# Patient Record
Sex: Male | Born: 1935 | Race: White | Hispanic: No | Marital: Married | Smoking: Former smoker
Health system: Southern US, Community
[De-identification: ages and names within clinical notes are randomized; demographics above are authoritative.]

## PROBLEM LIST (undated history)

## (undated) DIAGNOSIS — G5600 Carpal tunnel syndrome, unspecified upper limb: Secondary | ICD-10-CM

## (undated) DIAGNOSIS — R350 Frequency of micturition: Secondary | ICD-10-CM

## (undated) DIAGNOSIS — J449 Chronic obstructive pulmonary disease, unspecified: Secondary | ICD-10-CM

## (undated) DIAGNOSIS — F0390 Unspecified dementia without behavioral disturbance: Secondary | ICD-10-CM

## (undated) DIAGNOSIS — K449 Diaphragmatic hernia without obstruction or gangrene: Secondary | ICD-10-CM

## (undated) DIAGNOSIS — J189 Pneumonia, unspecified organism: Secondary | ICD-10-CM

## (undated) DIAGNOSIS — C679 Malignant neoplasm of bladder, unspecified: Secondary | ICD-10-CM

## (undated) DIAGNOSIS — E538 Deficiency of other specified B group vitamins: Secondary | ICD-10-CM

## (undated) DIAGNOSIS — F419 Anxiety disorder, unspecified: Secondary | ICD-10-CM

## (undated) DIAGNOSIS — K219 Gastro-esophageal reflux disease without esophagitis: Secondary | ICD-10-CM

## (undated) DIAGNOSIS — K8050 Calculus of bile duct without cholangitis or cholecystitis without obstruction: Secondary | ICD-10-CM

## (undated) DIAGNOSIS — R0602 Shortness of breath: Secondary | ICD-10-CM

## (undated) DIAGNOSIS — M1990 Unspecified osteoarthritis, unspecified site: Secondary | ICD-10-CM

## (undated) DIAGNOSIS — I10 Essential (primary) hypertension: Secondary | ICD-10-CM

## (undated) DIAGNOSIS — I2699 Other pulmonary embolism without acute cor pulmonale: Secondary | ICD-10-CM

## (undated) DIAGNOSIS — K222 Esophageal obstruction: Secondary | ICD-10-CM

## (undated) DIAGNOSIS — K5790 Diverticulosis of intestine, part unspecified, without perforation or abscess without bleeding: Secondary | ICD-10-CM

## (undated) DIAGNOSIS — C649 Malignant neoplasm of unspecified kidney, except renal pelvis: Secondary | ICD-10-CM

## (undated) DIAGNOSIS — F39 Unspecified mood [affective] disorder: Secondary | ICD-10-CM

## (undated) DIAGNOSIS — K851 Biliary acute pancreatitis without necrosis or infection: Secondary | ICD-10-CM

## (undated) HISTORY — DX: Deficiency of other specified B group vitamins: E53.8

## (undated) HISTORY — DX: Chronic obstructive pulmonary disease, unspecified: J44.9

## (undated) HISTORY — DX: Frequency of micturition: R35.0

## (undated) HISTORY — DX: Essential (primary) hypertension: I10

## (undated) HISTORY — PX: ESOPHAGOGASTRODUODENOSCOPY: SHX1529

## (undated) HISTORY — DX: Unspecified osteoarthritis, unspecified site: M19.90

## (undated) HISTORY — DX: Other pulmonary embolism without acute cor pulmonale: I26.99

## (undated) HISTORY — PX: PARTIAL NEPHRECTOMY: SHX414

## (undated) HISTORY — DX: Malignant neoplasm of unspecified kidney, except renal pelvis: C64.9

## (undated) HISTORY — DX: Unspecified dementia without behavioral disturbance: F03.90

## (undated) HISTORY — DX: Diaphragmatic hernia without obstruction or gangrene: K44.9

## (undated) HISTORY — DX: Calculus of bile duct without cholangitis or cholecystitis without obstruction: K80.50

## (undated) HISTORY — DX: Carpal tunnel syndrome, unspecified upper limb: G56.00

## (undated) HISTORY — DX: Unspecified mood (affective) disorder: F39

## (undated) HISTORY — DX: Biliary acute pancreatitis without necrosis or infection: K85.10

## (undated) HISTORY — PX: TRANSURETHRAL RESECTION OF PROSTATE: SHX73

## (undated) HISTORY — DX: Esophageal obstruction: K22.2

## (undated) HISTORY — DX: Diverticulosis of intestine, part unspecified, without perforation or abscess without bleeding: K57.90

## (undated) HISTORY — PX: CHOLECYSTECTOMY: SHX55

## (undated) HISTORY — DX: Malignant neoplasm of bladder, unspecified: C67.9

## (undated) HISTORY — PX: CYSTOSTOMY W/ BLADDER BIOPSY: SHX1431

## (undated) HISTORY — DX: Gastro-esophageal reflux disease without esophagitis: K21.9

---

## 1996-12-05 HISTORY — PX: INGUINAL HERNIA REPAIR: SUR1180

## 2001-03-14 ENCOUNTER — Ambulatory Visit (HOSPITAL_COMMUNITY): Admission: RE | Admit: 2001-03-14 | Discharge: 2001-03-14 | Payer: Self-pay | Admitting: Gastroenterology

## 2001-03-14 ENCOUNTER — Encounter (INDEPENDENT_AMBULATORY_CARE_PROVIDER_SITE_OTHER): Payer: Self-pay | Admitting: Specialist

## 2001-03-14 ENCOUNTER — Encounter: Payer: Self-pay | Admitting: Gastroenterology

## 2001-05-21 ENCOUNTER — Encounter: Payer: Self-pay | Admitting: Gastroenterology

## 2001-05-21 LAB — HM COLONOSCOPY

## 2001-05-29 ENCOUNTER — Encounter: Payer: Self-pay | Admitting: Urology

## 2001-05-29 ENCOUNTER — Encounter: Admission: RE | Admit: 2001-05-29 | Discharge: 2001-05-29 | Payer: Self-pay | Admitting: Urology

## 2001-06-08 ENCOUNTER — Encounter: Payer: Self-pay | Admitting: Urology

## 2001-06-13 ENCOUNTER — Inpatient Hospital Stay (HOSPITAL_COMMUNITY): Admission: RE | Admit: 2001-06-13 | Discharge: 2001-06-18 | Payer: Self-pay | Admitting: Urology

## 2001-06-13 ENCOUNTER — Encounter (INDEPENDENT_AMBULATORY_CARE_PROVIDER_SITE_OTHER): Payer: Self-pay | Admitting: Specialist

## 2001-06-13 ENCOUNTER — Encounter: Payer: Self-pay | Admitting: Urology

## 2001-06-15 ENCOUNTER — Encounter: Payer: Self-pay | Admitting: Urology

## 2002-02-02 ENCOUNTER — Encounter: Payer: Self-pay | Admitting: Emergency Medicine

## 2002-02-02 ENCOUNTER — Inpatient Hospital Stay (HOSPITAL_COMMUNITY): Admission: EM | Admit: 2002-02-02 | Discharge: 2002-02-14 | Payer: Self-pay | Admitting: Emergency Medicine

## 2002-02-03 ENCOUNTER — Encounter: Payer: Self-pay | Admitting: Emergency Medicine

## 2002-02-05 ENCOUNTER — Encounter: Payer: Self-pay | Admitting: Internal Medicine

## 2002-02-05 ENCOUNTER — Encounter: Payer: Self-pay | Admitting: General Surgery

## 2002-02-07 ENCOUNTER — Encounter: Payer: Self-pay | Admitting: Internal Medicine

## 2002-04-15 ENCOUNTER — Encounter: Payer: Self-pay | Admitting: Urology

## 2002-04-17 ENCOUNTER — Encounter (INDEPENDENT_AMBULATORY_CARE_PROVIDER_SITE_OTHER): Payer: Self-pay | Admitting: Specialist

## 2002-04-17 ENCOUNTER — Ambulatory Visit (HOSPITAL_COMMUNITY): Admission: RE | Admit: 2002-04-17 | Discharge: 2002-04-17 | Payer: Self-pay | Admitting: Urology

## 2002-09-30 ENCOUNTER — Emergency Department (HOSPITAL_COMMUNITY): Admission: EM | Admit: 2002-09-30 | Discharge: 2002-10-01 | Payer: Self-pay | Admitting: *Deleted

## 2002-10-05 ENCOUNTER — Encounter: Payer: Self-pay | Admitting: Emergency Medicine

## 2002-10-05 ENCOUNTER — Inpatient Hospital Stay (HOSPITAL_COMMUNITY): Admission: EM | Admit: 2002-10-05 | Discharge: 2002-10-12 | Payer: Self-pay | Admitting: Emergency Medicine

## 2002-10-06 ENCOUNTER — Encounter: Payer: Self-pay | Admitting: Internal Medicine

## 2002-10-10 ENCOUNTER — Encounter (INDEPENDENT_AMBULATORY_CARE_PROVIDER_SITE_OTHER): Payer: Self-pay | Admitting: *Deleted

## 2002-10-10 ENCOUNTER — Encounter: Payer: Self-pay | Admitting: General Surgery

## 2002-10-11 ENCOUNTER — Encounter: Payer: Self-pay | Admitting: General Surgery

## 2002-10-11 ENCOUNTER — Encounter: Payer: Self-pay | Admitting: Internal Medicine

## 2002-11-06 ENCOUNTER — Encounter: Payer: Self-pay | Admitting: Urology

## 2002-11-06 ENCOUNTER — Encounter: Admission: RE | Admit: 2002-11-06 | Discharge: 2002-11-06 | Payer: Self-pay | Admitting: Urology

## 2003-10-01 ENCOUNTER — Encounter (INDEPENDENT_AMBULATORY_CARE_PROVIDER_SITE_OTHER): Payer: Self-pay | Admitting: *Deleted

## 2003-10-01 ENCOUNTER — Ambulatory Visit (HOSPITAL_COMMUNITY): Admission: RE | Admit: 2003-10-01 | Discharge: 2003-10-01 | Payer: Self-pay | Admitting: Urology

## 2003-10-01 ENCOUNTER — Ambulatory Visit (HOSPITAL_BASED_OUTPATIENT_CLINIC_OR_DEPARTMENT_OTHER): Admission: RE | Admit: 2003-10-01 | Discharge: 2003-10-01 | Payer: Self-pay | Admitting: Urology

## 2004-02-04 ENCOUNTER — Ambulatory Visit (HOSPITAL_COMMUNITY): Admission: RE | Admit: 2004-02-04 | Discharge: 2004-02-04 | Payer: Self-pay | Admitting: Urology

## 2004-08-18 ENCOUNTER — Ambulatory Visit (HOSPITAL_COMMUNITY): Admission: RE | Admit: 2004-08-18 | Discharge: 2004-08-18 | Payer: Self-pay | Admitting: Urology

## 2004-09-01 ENCOUNTER — Encounter (INDEPENDENT_AMBULATORY_CARE_PROVIDER_SITE_OTHER): Payer: Self-pay | Admitting: Specialist

## 2004-09-01 ENCOUNTER — Ambulatory Visit (HOSPITAL_COMMUNITY): Admission: RE | Admit: 2004-09-01 | Discharge: 2004-09-01 | Payer: Self-pay | Admitting: Urology

## 2004-09-01 ENCOUNTER — Ambulatory Visit (HOSPITAL_BASED_OUTPATIENT_CLINIC_OR_DEPARTMENT_OTHER): Admission: RE | Admit: 2004-09-01 | Discharge: 2004-09-01 | Payer: Self-pay | Admitting: Urology

## 2004-10-26 ENCOUNTER — Ambulatory Visit: Payer: Self-pay | Admitting: Family Medicine

## 2005-02-03 ENCOUNTER — Ambulatory Visit: Payer: Self-pay | Admitting: Gastroenterology

## 2005-03-09 ENCOUNTER — Encounter (INDEPENDENT_AMBULATORY_CARE_PROVIDER_SITE_OTHER): Payer: Self-pay | Admitting: Specialist

## 2005-03-09 ENCOUNTER — Ambulatory Visit (HOSPITAL_BASED_OUTPATIENT_CLINIC_OR_DEPARTMENT_OTHER): Admission: RE | Admit: 2005-03-09 | Discharge: 2005-03-09 | Payer: Self-pay | Admitting: Urology

## 2005-03-09 ENCOUNTER — Ambulatory Visit (HOSPITAL_COMMUNITY): Admission: RE | Admit: 2005-03-09 | Discharge: 2005-03-09 | Payer: Self-pay | Admitting: Urology

## 2005-08-17 ENCOUNTER — Ambulatory Visit (HOSPITAL_COMMUNITY): Admission: RE | Admit: 2005-08-17 | Discharge: 2005-08-17 | Payer: Self-pay | Admitting: Urology

## 2005-11-23 ENCOUNTER — Ambulatory Visit (HOSPITAL_COMMUNITY): Admission: RE | Admit: 2005-11-23 | Discharge: 2005-11-23 | Payer: Self-pay | Admitting: Urology

## 2005-11-23 ENCOUNTER — Encounter (INDEPENDENT_AMBULATORY_CARE_PROVIDER_SITE_OTHER): Payer: Self-pay | Admitting: Specialist

## 2006-03-30 ENCOUNTER — Ambulatory Visit: Payer: Self-pay | Admitting: Gastroenterology

## 2006-05-10 ENCOUNTER — Ambulatory Visit (HOSPITAL_BASED_OUTPATIENT_CLINIC_OR_DEPARTMENT_OTHER): Admission: RE | Admit: 2006-05-10 | Discharge: 2006-05-10 | Payer: Self-pay | Admitting: Urology

## 2006-05-10 ENCOUNTER — Encounter (INDEPENDENT_AMBULATORY_CARE_PROVIDER_SITE_OTHER): Payer: Self-pay | Admitting: Specialist

## 2007-04-06 ENCOUNTER — Ambulatory Visit: Payer: Self-pay | Admitting: Gastroenterology

## 2007-12-14 ENCOUNTER — Ambulatory Visit: Payer: Self-pay | Admitting: Internal Medicine

## 2007-12-14 DIAGNOSIS — H9319 Tinnitus, unspecified ear: Secondary | ICD-10-CM | POA: Insufficient documentation

## 2007-12-14 DIAGNOSIS — R3915 Urgency of urination: Secondary | ICD-10-CM

## 2007-12-14 DIAGNOSIS — C649 Malignant neoplasm of unspecified kidney, except renal pelvis: Secondary | ICD-10-CM | POA: Insufficient documentation

## 2007-12-14 DIAGNOSIS — G589 Mononeuropathy, unspecified: Secondary | ICD-10-CM | POA: Insufficient documentation

## 2007-12-14 DIAGNOSIS — C679 Malignant neoplasm of bladder, unspecified: Secondary | ICD-10-CM | POA: Insufficient documentation

## 2007-12-17 LAB — CONVERTED CEMR LAB
ALT: 15 units/L (ref 0–53)
AST: 24 units/L (ref 0–37)
Albumin: 4.3 g/dL (ref 3.5–5.2)
Alkaline Phosphatase: 73 units/L (ref 39–117)
BUN: 6 mg/dL (ref 6–23)
Basophils Absolute: 0.1 10*3/uL (ref 0.0–0.1)
Basophils Relative: 0.9 % (ref 0.0–1.0)
Bilirubin, Direct: 0.1 mg/dL (ref 0.0–0.3)
CO2: 31 meq/L (ref 19–32)
Calcium: 9.3 mg/dL (ref 8.4–10.5)
Chloride: 101 meq/L (ref 96–112)
Creatinine, Ser: 0.9 mg/dL (ref 0.4–1.5)
Eosinophils Absolute: 0.4 10*3/uL (ref 0.0–0.6)
Eosinophils Relative: 5.6 % — ABNORMAL HIGH (ref 0.0–5.0)
GFR calc Af Amer: 104 mL/min
GFR calc non Af Amer: 86 mL/min
Glucose, Bld: 92 mg/dL (ref 70–99)
HCT: 45.9 % (ref 39.0–52.0)
Hemoglobin: 15.4 g/dL (ref 13.0–17.0)
Lymphocytes Relative: 28.3 % (ref 12.0–46.0)
MCHC: 33.5 g/dL (ref 30.0–36.0)
MCV: 85.9 fL (ref 78.0–100.0)
Monocytes Absolute: 0.6 10*3/uL (ref 0.2–0.7)
Monocytes Relative: 9.5 % (ref 3.0–11.0)
Neutro Abs: 3.6 10*3/uL (ref 1.4–7.7)
Neutrophils Relative %: 55.7 % (ref 43.0–77.0)
Phosphorus: 3.3 mg/dL (ref 2.3–4.6)
Platelets: 132 10*3/uL — ABNORMAL LOW (ref 150–400)
Potassium: 3.7 meq/L (ref 3.5–5.1)
RBC: 5.35 M/uL (ref 4.22–5.81)
RDW: 13.2 % (ref 11.5–14.6)
Sodium: 140 meq/L (ref 135–145)
TSH: 1.32 microintl units/mL (ref 0.35–5.50)
Total Bilirubin: 1.1 mg/dL (ref 0.3–1.2)
Total Protein: 7.4 g/dL (ref 6.0–8.3)
Vitamin B-12: 240 pg/mL (ref 211–911)
WBC: 6.5 10*3/uL (ref 4.5–10.5)

## 2008-01-16 ENCOUNTER — Encounter: Payer: Self-pay | Admitting: Internal Medicine

## 2008-04-11 ENCOUNTER — Ambulatory Visit: Payer: Self-pay | Admitting: Gastroenterology

## 2008-04-11 ENCOUNTER — Encounter: Payer: Self-pay | Admitting: Gastroenterology

## 2008-04-11 LAB — CONVERTED CEMR LAB
ALT: 13 units/L (ref 0–53)
AST: 20 units/L (ref 0–37)
Albumin: 4 g/dL (ref 3.5–5.2)
Alkaline Phosphatase: 75 units/L (ref 39–117)
BUN: 5 mg/dL — ABNORMAL LOW (ref 6–23)
Basophils Absolute: 0.1 10*3/uL (ref 0.0–0.1)
Basophils Relative: 1.1 % — ABNORMAL HIGH (ref 0.0–1.0)
Bilirubin, Direct: 0.1 mg/dL (ref 0.0–0.3)
CO2: 33 meq/L — ABNORMAL HIGH (ref 19–32)
Calcium: 9.2 mg/dL (ref 8.4–10.5)
Chloride: 110 meq/L (ref 96–112)
Creatinine, Ser: 0.9 mg/dL (ref 0.4–1.5)
Eosinophils Absolute: 0.3 10*3/uL (ref 0.0–0.7)
Eosinophils Relative: 3.7 % (ref 0.0–5.0)
Ferritin: 61.5 ng/mL (ref 22.0–322.0)
Folate: 17.7 ng/mL
GFR calc Af Amer: 104 mL/min
GFR calc non Af Amer: 86 mL/min
Glucose, Bld: 100 mg/dL — ABNORMAL HIGH (ref 70–99)
HCT: 45.4 % (ref 39.0–52.0)
Hemoglobin: 15.3 g/dL (ref 13.0–17.0)
Iron: 85 ug/dL (ref 42–165)
Lymphocytes Relative: 21.8 % (ref 12.0–46.0)
MCHC: 33.6 g/dL (ref 30.0–36.0)
MCV: 84.9 fL (ref 78.0–100.0)
Monocytes Absolute: 0.8 10*3/uL (ref 0.1–1.0)
Monocytes Relative: 9.9 % (ref 3.0–12.0)
Neutro Abs: 4.8 10*3/uL (ref 1.4–7.7)
Neutrophils Relative %: 63.5 % (ref 43.0–77.0)
Platelets: 153 10*3/uL (ref 150–400)
Potassium: 3.9 meq/L (ref 3.5–5.1)
RBC: 5.35 M/uL (ref 4.22–5.81)
RDW: 13.3 % (ref 11.5–14.6)
Saturation Ratios: 25 % (ref 20.0–50.0)
Sodium: 144 meq/L (ref 135–145)
Total Bilirubin: 0.8 mg/dL (ref 0.3–1.2)
Total Protein: 7 g/dL (ref 6.0–8.3)
Transferrin: 242.5 mg/dL (ref >=212.0)
Vitamin B-12: 336 pg/mL (ref 211–911)
WBC: 7.7 10*3/uL (ref 4.5–10.5)

## 2008-04-16 ENCOUNTER — Ambulatory Visit: Payer: Self-pay | Admitting: Gastroenterology

## 2008-04-21 ENCOUNTER — Telehealth: Payer: Self-pay | Admitting: Gastroenterology

## 2008-05-19 ENCOUNTER — Encounter: Payer: Self-pay | Admitting: Family Medicine

## 2008-05-26 ENCOUNTER — Encounter: Payer: Self-pay | Admitting: Internal Medicine

## 2008-11-21 ENCOUNTER — Encounter: Payer: Self-pay | Admitting: Internal Medicine

## 2009-05-21 DIAGNOSIS — K2980 Duodenitis without bleeding: Secondary | ICD-10-CM | POA: Insufficient documentation

## 2009-05-21 DIAGNOSIS — K222 Esophageal obstruction: Secondary | ICD-10-CM | POA: Insufficient documentation

## 2009-05-21 DIAGNOSIS — K449 Diaphragmatic hernia without obstruction or gangrene: Secondary | ICD-10-CM | POA: Insufficient documentation

## 2009-05-22 ENCOUNTER — Ambulatory Visit: Payer: Self-pay | Admitting: Gastroenterology

## 2009-05-22 DIAGNOSIS — E538 Deficiency of other specified B group vitamins: Secondary | ICD-10-CM | POA: Insufficient documentation

## 2009-05-22 DIAGNOSIS — R209 Unspecified disturbances of skin sensation: Secondary | ICD-10-CM

## 2009-05-22 LAB — CONVERTED CEMR LAB
Ferritin: 118.1 ng/mL (ref 22.0–322.0)
Folate: 11.7 ng/mL
Iron: 90 ug/dL (ref 42–165)
Saturation Ratios: 29.2 % (ref 20.0–50.0)
Transferrin: 220.5 mg/dL (ref 212.0–360.0)
Vitamin B-12: >1500 pg/mL — ABNORMAL HIGH (ref 211–911)

## 2009-07-03 ENCOUNTER — Encounter: Payer: Self-pay | Admitting: Internal Medicine

## 2009-10-07 ENCOUNTER — Ambulatory Visit: Payer: Self-pay | Admitting: Internal Medicine

## 2009-10-07 DIAGNOSIS — I10 Essential (primary) hypertension: Secondary | ICD-10-CM

## 2009-10-09 LAB — CONVERTED CEMR LAB
ALT: 16 units/L (ref 0–53)
AST: 23 units/L (ref 0–37)
Albumin: 4.1 g/dL (ref 3.5–5.2)
Alkaline Phosphatase: 65 units/L (ref 39–117)
BUN: 6 mg/dL (ref 6–23)
Basophils Absolute: 0 10*3/uL (ref 0.0–0.1)
Basophils Relative: 0 % (ref 0.0–3.0)
Bilirubin, Direct: 0.1 mg/dL (ref 0.0–0.3)
CO2: 34 meq/L — ABNORMAL HIGH (ref 19–32)
Calcium: 8.9 mg/dL (ref 8.4–10.5)
Chloride: 103 meq/L (ref 96–112)
Creatinine, Ser: 0.9 mg/dL (ref 0.4–1.5)
Creatinine,U: 116.1 mg/dL
Eosinophils Absolute: 0.2 10*3/uL (ref 0.0–0.7)
Eosinophils Relative: 3.5 % (ref 0.0–5.0)
GFR calc non Af Amer: 85.88 mL/min (ref >60)
Glucose, Bld: 87 mg/dL (ref 70–99)
HCT: 43 % (ref 39.0–52.0)
Hemoglobin: 15 g/dL (ref 13.0–17.0)
Lymphocytes Relative: 25.6 % (ref 12.0–46.0)
Lymphs Abs: 1.8 10*3/uL (ref 0.7–4.0)
MCHC: 34.9 g/dL (ref 30.0–36.0)
MCV: 87.7 fL (ref 78.0–100.0)
Microalb Creat Ratio: 43.9 mg/g — ABNORMAL HIGH (ref 0.0–30.0)
Microalb, Ur: 5.1 mg/dL — ABNORMAL HIGH (ref 0.0–1.9)
Monocytes Absolute: 0.6 10*3/uL (ref 0.1–1.0)
Monocytes Relative: 8.8 % (ref 3.0–12.0)
Neutro Abs: 4.5 10*3/uL (ref 1.4–7.7)
Neutrophils Relative %: 62.1 % (ref 43.0–77.0)
Phosphorus: 3 mg/dL (ref 2.3–4.6)
Platelets: 133 10*3/uL — ABNORMAL LOW (ref 150.0–400.0)
Potassium: 4 meq/L (ref 3.5–5.1)
RBC: 4.91 M/uL (ref 4.22–5.81)
RDW: 12.8 % (ref 11.5–14.6)
Sodium: 142 meq/L (ref 135–145)
TSH: 0.97 microintl units/mL (ref 0.35–5.50)
Total Bilirubin: 0.9 mg/dL (ref 0.3–1.2)
Total Protein: 7.4 g/dL (ref 6.0–8.3)
Vitamin B-12: 679 pg/mL (ref 211–911)
WBC: 7.1 10*3/uL (ref 4.5–10.5)

## 2009-11-11 ENCOUNTER — Ambulatory Visit: Payer: Self-pay | Admitting: Internal Medicine

## 2009-11-13 LAB — CONVERTED CEMR LAB
Albumin: 4.3 g/dL (ref 3.5–5.2)
BUN: 11 mg/dL (ref 6–23)
Basophils Absolute: 0.1 10*3/uL (ref 0.0–0.1)
Basophils Relative: 0.9 % (ref 0.0–3.0)
CO2: 33 meq/L — ABNORMAL HIGH (ref 19–32)
Calcium: 9.6 mg/dL (ref 8.4–10.5)
Chloride: 102 meq/L (ref 96–112)
Creatinine, Ser: 1.1 mg/dL (ref 0.4–1.5)
Eosinophils Absolute: 0.3 10*3/uL (ref 0.0–0.7)
Eosinophils Relative: 3.9 % (ref 0.0–5.0)
GFR calc non Af Amer: 68.11 mL/min (ref >60)
Glucose, Bld: 110 mg/dL — ABNORMAL HIGH (ref 70–99)
HCT: 47.4 % (ref 39.0–52.0)
Hemoglobin: 15.9 g/dL (ref 13.0–17.0)
Lymphocytes Relative: 23.8 % (ref 12.0–46.0)
Lymphs Abs: 1.8 10*3/uL (ref 0.7–4.0)
MCHC: 33.7 g/dL (ref 30.0–36.0)
MCV: 87.7 fL (ref 78.0–100.0)
Monocytes Absolute: 0.7 10*3/uL (ref 0.1–1.0)
Monocytes Relative: 8.7 % (ref 3.0–12.0)
Neutro Abs: 4.6 10*3/uL (ref 1.4–7.7)
Neutrophils Relative %: 62.7 % (ref 43.0–77.0)
Phosphorus: 3.4 mg/dL (ref 2.3–4.6)
Platelets: 146 10*3/uL — ABNORMAL LOW (ref 150.0–400.0)
Potassium: 4.6 meq/L (ref 3.5–5.1)
RBC: 5.4 M/uL (ref 4.22–5.81)
RDW: 12.8 % (ref 11.5–14.6)
Sodium: 142 meq/L (ref 135–145)
Vitamin B-12: 922 pg/mL — ABNORMAL HIGH (ref 211–911)
WBC: 7.5 10*3/uL (ref 4.5–10.5)

## 2009-12-22 ENCOUNTER — Encounter: Payer: Self-pay | Admitting: Internal Medicine

## 2010-04-28 ENCOUNTER — Ambulatory Visit: Payer: Self-pay | Admitting: Internal Medicine

## 2010-04-28 DIAGNOSIS — G5600 Carpal tunnel syndrome, unspecified upper limb: Secondary | ICD-10-CM

## 2010-07-08 ENCOUNTER — Encounter: Payer: Self-pay | Admitting: Internal Medicine

## 2010-10-14 ENCOUNTER — Encounter: Payer: Self-pay | Admitting: Internal Medicine

## 2010-10-25 ENCOUNTER — Ambulatory Visit: Payer: Self-pay | Admitting: Internal Medicine

## 2010-10-26 LAB — CONVERTED CEMR LAB
ALT: 9 units/L (ref 0–53)
AST: 17 units/L (ref 0–37)
Albumin: 4 g/dL (ref 3.5–5.2)
Alkaline Phosphatase: 56 units/L (ref 39–117)
BUN: 8 mg/dL (ref 6–23)
Basophils Absolute: 0.1 10*3/uL (ref 0.0–0.1)
Basophils Relative: 0.7 % (ref 0.0–3.0)
Bilirubin, Direct: 0.1 mg/dL (ref 0.0–0.3)
CO2: 29 meq/L (ref 19–32)
Calcium: 8.9 mg/dL (ref 8.4–10.5)
Chloride: 101 meq/L (ref 96–112)
Creatinine, Ser: 1.1 mg/dL (ref 0.4–1.5)
Eosinophils Absolute: 0.3 10*3/uL (ref 0.0–0.7)
Eosinophils Relative: 3.7 % (ref 0.0–5.0)
GFR calc non Af Amer: 70.92 mL/min (ref >60)
Glucose, Bld: 95 mg/dL (ref 70–99)
HCT: 41.5 % (ref 39.0–52.0)
Hemoglobin: 13.8 g/dL (ref 13.0–17.0)
Lymphocytes Relative: 23.3 % (ref 12.0–46.0)
Lymphs Abs: 1.8 10*3/uL (ref 0.7–4.0)
MCHC: 33.3 g/dL (ref 30.0–36.0)
MCV: 87.4 fL (ref 78.0–100.0)
Monocytes Absolute: 0.7 10*3/uL (ref 0.1–1.0)
Monocytes Relative: 8.3 % (ref 3.0–12.0)
Neutro Abs: 5 10*3/uL (ref 1.4–7.7)
Neutrophils Relative %: 64 % (ref 43.0–77.0)
Phosphorus: 2.6 mg/dL (ref 2.3–4.6)
Platelets: 129 10*3/uL — ABNORMAL LOW (ref 150.0–400.0)
Potassium: 3.7 meq/L (ref 3.5–5.1)
RBC: 4.75 M/uL (ref 4.22–5.81)
RDW: 14 % (ref 11.5–14.6)
Sodium: 141 meq/L (ref 135–145)
TSH: 0.93 microintl units/mL (ref 0.35–5.50)
Total Bilirubin: 0.7 mg/dL (ref 0.3–1.2)
Total Protein: 6.8 g/dL (ref 6.0–8.3)
WBC: 7.9 10*3/uL (ref 4.5–10.5)

## 2010-11-04 HISTORY — PX: CARPAL TUNNEL RELEASE: SHX101

## 2010-11-08 ENCOUNTER — Encounter: Admission: RE | Admit: 2010-11-08 | Discharge: 2010-11-08 | Payer: Self-pay | Admitting: Orthopedic Surgery

## 2010-11-10 ENCOUNTER — Ambulatory Visit
Admission: RE | Admit: 2010-11-10 | Discharge: 2010-11-10 | Payer: Self-pay | Source: Home / Self Care | Attending: Orthopedic Surgery | Admitting: Orthopedic Surgery

## 2011-01-04 NOTE — Letter (Signed)
Summary: [facility]  [facility]   Imported By: Sherian Rein 12/25/2009 15:19:47  _____________________________________________________________________  External Attachment:    Type:   Image     Comment:   External Document  Appended Document: [facility] cysto benign

## 2011-01-04 NOTE — Consult Note (Signed)
Summary: [facility]  [facility] [place]   Imported By: Maryln Gottron 10/29/2010 15:58:10  _____________________________________________________________________  External Attachment:    Type:   Image     Comment:   External Document  Appended Document: [facility] planning right carpal tunnel release and then left some time after

## 2011-01-04 NOTE — Letter (Signed)
Summary: [facility]  [facility]   Imported By: Lanelle Bal 07/16/2010 09:44:55  _____________________________________________________________________  External Attachment:    Type:   Image     Comment:   External Document  Appended Document: [facility] cystoscopy shows no evidence of recurrent bladder cancer

## 2011-01-04 NOTE — Assessment & Plan Note (Signed)
Summary: 6 M F/U DLO   Vital Signs:  Patient profile:   77 year old male Weight:      176 pounds BMI:     24.99 Temp:     97.6 degrees F oral Pulse rate:   60 / minute Pulse rhythm:   regular BP sitting:   130 / 80  (left arm) Cuff size:   large  Vitals Entered By: Mervin Hack CMA Duncan Dull) (Apr 28, 2010 8:07 AM)  Serial Vital Signs/Assessments:  Time      Position  BP       Pulse  Resp  Temp     By           R Arm     140/68                         Cindee Salt MD  CC: 6 month follow-up   History of Present Illness: Having some problems with numbness in his hands Intermittent in both-- will wake him up at times occ burning sensation seems worse in cold weather does describe median nerve involvement  Back pain--esp if prolonged standing or walking no problem working in garden (and will work there for a long time) aspirin or advil will help  Stomach has been fine no symptoms on medicine  no problems with BP med no chest pain Does get DOE with extended walking No edema  Allergies: 1)  ! Tylenol  Past History:  Past medical, surgical, family and social histories (including risk factors) reviewed for relevance to current acute and chronic problems.  Past Medical History: Reviewed history from 10/07/2009 and no changes required. Bladder cancer--recurrent small tumors--excisions and BCG-------------------Dr Grapey Renal cancer--2002 Pulmonary embolus--soon after cancer surgery GERD------------------------------------------------------------------------------------Dr Jarold Motto Osteoarthritis Urinary frequency/urgency Hypertension  Past Surgical History: Reviewed history from 12/14/2007 and no changes required. 2002--renal and bladder surgery 1997/05/05- Right inguinal hernia 2003--Cholecystectomy EGD with dilation  ~ 05-May-2001 Colonoscopy  ~  05-May-2001  Family History: Mom died @77  stroke, heart problem Dad died of old age @87  3 brothers, 2 sisters--all died 1  brother had myelodysplasia Some CAD, DM Mom had HTN No prostate or colon cancer  Social History: Reviewed history from 12/14/2007 and no changes required. 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   Neck:  no masses, no thyromegaly, no carotid bruits, and no cervical lymphadenopathy.   very limited extension Lungs:  normal respiratory effort and normal breath sounds.   Heart:  normal rate, regular rhythm, no murmur, and no gallop.   Abdomen:  soft and non-tender.   Msk:  normal thenar emminence bilat--no muscle wasting Extremities:  no edema Neurologic:  normal hand and grip strength Skin:  multiple seb keratoses on back Psych:  normally interactive, good eye contact, not anxious appearing, and not depressed appearing.     Impression & Recommendations:  Problem # 1:  CARPAL TUNNEL SYNDROME (ICD-354.0) Assessment New  ongoing symptoms no weakness   discussed splints for night will refer to hand surgeon for eval  Orders: Orthopedic  Surgeon Referral (Ortho Surgeon)  Problem # 2:  HYPERTENSION (ICD-401.9) Assessment: Unchanged good control no changes needed  His updated medication list for this problem includes:    Triamterene-hctz 37.5-25 Mg Tabs (Triamterene-hctz) .Marland Kitchen... 1 tab daily for high blood pressure  BP today: 130/80 Prior BP: 110/70 (11/11/2009)  Labs Reviewed: K+: 4.6 (11/11/2009) Creat: : 1.1 (11/11/2009)     Problem # 3:  GERD (ICD-530.81) Assessment: Unchanged with past stricture needs to stay on PPI  His updated  medication list for this problem includes:    Nexium 40 Mg Cpdr (Esomeprazole magnesium) .Marland Kitchen... Take 1 tablet by mouth once a day  Problem # 4:  OSTEOARTHRITIS (ICD-715.90) Assessment: Unchanged esp back uses aspirin or low dose NSAID may have ankylosing spondylitis but no further eval indicated now  Complete Medication List: 1)  Triamterene-hctz 37.5-25 Mg Tabs (Triamterene-hctz) .Marland Kitchen.. 1 tab daily for high blood pressure 2)  Nexium 40 Mg Cpdr (Esomeprazole magnesium) .... Take 1 tablet by mouth once a day 3)  Advil Pm 200-25 Mg Caps (Ibuprofen-diphenhydramine hcl) .... As needed 4)  Vitamin B-12 500 Mcg Tabs (Cyanocobalamin) .... One tablet by mouth two times a day 5)  Bayer Low Strength 81 Mg Tbec (Aspirin) .... Take 1 by mouth once daily 6)  Bayer Aspirin 325 Mg Tabs (Aspirin) .... As needed  Patient Instructions: 1)  Please schedule a follow-up appointment in 6 months .  2)  Referral Appointment Information 3)  Day/Date: 4)  Time: 5)  Place/MD: 6)  Address: 7)  Phone/Fax: 8)  Patient given appointment information. Information/Orders faxed/mailed.  Current Allergies (reviewed today): ! TYLENOL

## 2011-01-04 NOTE — Assessment & Plan Note (Signed)
Summary: 6 M F/U DLO   Vital Signs:  Patient profile:   77 year old male Weight:      180 pounds O2 Sat:      95 % on Room air Temp:     97.8 degrees F oral Pulse rate:   58 / minute Pulse rhythm:   regular BP sitting:   146 / 60  (left arm) Cuff size:   large  Vitals Entered By: Mervin Hack CMA Duncan Dull) (October 25, 2010 12:20 PM)  O2 Flow:  Room air CC: 6 month follow-up   History of Present Illness: Did see surgeon eventually for his hands Then saw Dr Mina Marble in past couple of weeks Having surgery on right wrist soon has had increasing weakness so intervention appopriate  Gets upset quickly at times---per wife usually not due to something serious but he gets worked up not that frequently Generally happy No ongoing anxiety  Recent cystoscopy negative no hematuria  wife checks BP at times--it hurt his arm so she isn't checking now No chest pain No SOB No headaches  Does have arthritis pain in hands hard time making a fist on right Right shoulder and knee pain at times uses advil as needed --- does help  stomach okay No problems on the nexium---doesn't need it every day  Gets SOB when walking but exercise tolerance is stable  Allergies: 1)  ! Tylenol  Past History:  Past medical, surgical, family and social histories (including risk factors) reviewed for relevance to current acute and chronic problems.  Past Medical History: Bladder cancer--recurrent small tumors--excisions and BCG-------------------Dr Grapey Renal cancer--2002 Pulmonary embolus--soon after cancer surgery GERD------------------------------------------------------------------------------------Dr Jarold Motto Osteoarthritis Urinary frequency/urgency Hypertension COPD  Past Surgical History: Reviewed history from 12/14/2007 and no changes required. 2002--renal and bladder surgery 1998- Right inguinal hernia 2003--Cholecystectomy EGD with dilation  ~ 2002 Colonoscopy  ~   2002  Family History: Reviewed history from 04/28/2010 and no changes required. Mom died @77  stroke, heart problem Dad died of old age @87  3 brothers, 2 sisters--all died 1 brother had myelodysplasia Some CAD, DM Mom had HTN No prostate or colon cancer  Social History: Reviewed history from 12/14/2007 and no changes required. 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   Neck:  supple, no masses, no thyromegaly, no carotid bruits, and no cervical lymphadenopathy.   Lungs:  normal respiratory effort, no intercostal retractions, no accessory muscle use, and normal breath sounds.   Heart:  normal rate, regular rhythm, no murmur, and no gallop.   Abdomen:  soft and non-tender.   Msk:  no joint tenderness and no joint swelling.   SOme thickening in hands Extremities:  no edema Psych:  normally interactive, good eye contact, not anxious appearing, and not depressed appearing.     Impression & Recommendations:  Problem # 1:  HYPERTENSION (ICD-401.9) Assessment Unchanged  good control no problems with the med  His updated medication list for this problem includes:    Triamterene-hctz 37.5-25 Mg Tabs (Triamterene-hctz) .Marland Kitchen... 1 tab daily for high blood pressure  BP today: 146/60 Prior BP:  130/80 (04/28/2010)  Labs Reviewed: K+: 4.6 (11/11/2009) Creat: : 1.1 (11/11/2009)     Orders: TLB-Renal Function Panel (80069-RENAL) TLB-CBC Platelet - w/Differential (85025-CBCD) TLB-Hepatic/Liver Function Pnl (80076-HEPATIC) TLB-TSH (Thyroid Stimulating Hormone)  (84443-TSH) Venipuncture (20254)  Problem # 2:  GERD (ICD-530.81) Assessment: Unchanged good control with the medication doesn't need every day now  His updated medication list for this problem includes:    Nexium 40 Mg Cpdr (Esomeprazole magnesium) .Marland Kitchen... Take 1 tablet by mouth once a day  Problem # 3:  OSTEOARTHRITIS (ICD-715.90) Assessment: Comment Only okay with occ NSAID on PPI generally but will check labs  His updated medication list for this problem includes:    Bayer Low Strength 81 Mg Tbec (Aspirin) .Marland Kitchen... Take 1 by mouth once daily    Bayer Aspirin 325 Mg Tabs (Aspirin) .Marland Kitchen... As needed  Problem # 4:  COPD (ICD-496) Assessment: Unchanged mild DOE which is stable no meds needed  Complete Medication List: 1)  Triamterene-hctz 37.5-25 Mg Tabs (Triamterene-hctz) .Marland Kitchen.. 1 tab daily for high blood pressure 2)  Nexium 40 Mg Cpdr (Esomeprazole magnesium) .... Take 1 tablet by mouth once a day 3)  Advil Pm 200-25 Mg Caps (Ibuprofen-diphenhydramine hcl) .... As needed 4)  Vitamin B-12 500 Mcg Tabs (Cyanocobalamin) .... One tablet by mouth two times a day 5)  Bayer Low Strength 81 Mg Tbec (Aspirin) .... Take 1 by mouth once daily 6)  Bayer Aspirin 325 Mg Tabs (Aspirin) .... As needed 7)  Sm Potassium Gluconate 2.5 Meq Tabs (Potassium gluconate) .... Once daily 8)  Fish Oil 1000 Mg Caps (Omega-3 fatty acids) .... Once daily  Patient Instructions: 1)  Please schedule a follow-up appointment in 6 months .    Orders Added: 1)  TLB-Renal Function Panel [80069-RENAL] 2)  TLB-CBC Platelet - w/Differential [85025-CBCD] 3)  TLB-Hepatic/Liver Function Pnl [80076-HEPATIC] 4)  TLB-TSH (Thyroid Stimulating Hormone) [84443-TSH] 5)  Venipuncture [27062] 6)  Est. Patient Level IV [37628]    Current Allergies (reviewed today): ! TYLENOL

## 2011-01-18 ENCOUNTER — Encounter: Payer: Self-pay | Admitting: Internal Medicine

## 2011-02-01 NOTE — Letter (Signed)
Summary: [facility]  [facility]   Imported By: Kassie Mends 01/25/2011 10:05:01  _____________________________________________________________________  External Attachment:    Type:   Image     Comment:   External Document  Appended Document: [facility] renal ultrasound stable (post op changes) cysto just showed enlarged prostate

## 2011-02-14 LAB — BASIC METABOLIC PANEL
BUN: 7 mg/dL (ref 6–23)
CO2: 27 mEq/L (ref 19–32)
Calcium: 9 mg/dL (ref 8.4–10.5)
Chloride: 106 mEq/L (ref 96–112)
Creatinine, Ser: 0.9 mg/dL (ref 0.4–1.5)
GFR calc Af Amer: >60 mL/min (ref >60)
GFR calc non Af Amer: >60 mL/min (ref >60)
Glucose, Bld: 142 mg/dL — ABNORMAL HIGH (ref 70–99)
Potassium: 3.5 mEq/L (ref 3.5–5.1)
Sodium: 143 mEq/L (ref 135–145)

## 2011-02-14 LAB — POCT HEMOGLOBIN-HEMACUE: Hemoglobin: 14 g/dL (ref 13.0–17.0)

## 2011-04-19 NOTE — Assessment & Plan Note (Signed)
[facility]                         GASTROENTEROLOGY OFFICE NOTE   Bryan, David                        MRN:          [id number]  DATE:04/11/2008                            DOB:          [birth year]    Bryan David is having worsening reflux and dysphagia.  He, otherwise is  asymptomatic.  He has severe chronic obstructive lung disease and feels  like his dyspnea is worse and he has really limited exercise tolerance.  He has not had a chest x-ray in many years. He has a dry, nonproductive  cough.  He denies any cardiovascular issues. His bowels are moving well  and he denies any melena or hematochezia.  He is up to date on his  colonoscopy exam.  He is followed now by Dr. Alphonsus Sias but I cannot see  where he has had labs done in some time.  His past history is remarkable  for previous nephrectomy for hypernephroma and also cholecystectomy.  He  has rather frequent urologic evaluations with Dr. Isabel Caprice.  I did check  labs in April of 2007 which were fairly normal.  This did not include  PSA level.  His surgery was previously performed by Dr. Claud Kelp.   MEDICATIONS:  The only medications he is taking at this time are Nexium  daily and weekly some B12 pills because of numbness and tingling in his  hands.  I cannot see on his chart where he has had a previous diagnosis  of B12 deficiency or pernicious anemia.   PHYSICAL EXAMINATION:  VITAL SIGNS:  He weighs 190 pounds and blood  pressure is 146/88, pulse 60 and regular.  HEENT:  I could not appreciate stigmata of chronic liver disease.  CHEST:  Showed diminished breath sounds in both lung fields with rhonchi  in the right lower lung field.  CARDIAC:  Exam showed him to be in a regular rhythm without murmurs,  rubs or gallops.  ABDOMEN:  Abdominal exam was difficult because of a board-like abdomen  because of musculature.  There are certainly no areas of tenderness or  noted masses.  Bowel sounds  were normal.  RECTAL:  Rectal exam is deferred.  EXTREMITIES:  Peripheral extremities were unremarkable.   ASSESSMENT:  Bryan David has worsening acid reflux despite daily Nexium  and this may contribute to some of his pulmonary fibrosis and emphysema.  He has a greater than 40 year smoking history.   RECOMMENDATIONS:  1. Check labs and chest x-ray.  2. Outpatient endoscopy and dilation at his convenience.  3. Reflux regime and increase Nexium to 40 mg twice a day.     Vania Rea. Jarold Motto, MD, Caleen Essex, FAGA  Electronically Signed    DRP/MedQ  DD: 04/11/2008  DT: 04/11/2008  Job #: [id number]   cc:   Valetta Fuller, M.D.  Karie Schwalbe, MD  Angelia Mould. Derrell Lolling, M.D.

## 2011-04-22 NOTE — H&P (Signed)
NAME:  Bryan David, NHAN NO.:  [id number]   MEDICAL RECORD NO.:  [id number]                   PATIENT TYPE:  INP   LOCATION:  0452                                 FACILITY:  [facility]   PHYSICIAN:  Georgina Quint. Plotnikov, M.D. [facility]      DATE OF BIRTH:  12-06-1924   DATE OF ADMISSION:  10/05/2002  DATE OF DISCHARGE:                                HISTORY & PHYSICAL   CHIEF COMPLAINT:  Nausea, vomiting, abdominal pain.   HISTORY OF PRESENT ILLNESS:  The patient is a 77 year old male with the  above complaints, by Monday getting progressively worse.  He was seen on  October 27 in the ER with abdominal complaints.  His amylase and lipase were  normal at the time.  It has gotten progressively worse with more abdominal  distention, nausea, and vomiting.  He was found to have highly elevated  amylase and lipase with abnormal liver function tests this time.  He has no  history of cholecystitis, history of GERD with esophageal strictures,  history of PE in March 2003 with subsequent anticoagulation, history of  right renal cell carcinoma and bladder cancer.   MEDICATIONS:  1. Nexium.  2. Coumadin.   ALLERGIES:  None.   SOCIAL HISTORY:  He is married.  No alcohol.  Remote history of smoking.   FAMILY HISTORY:  Positive for coronary disease.   REVIEW OF SYSTEMS:  Shortness of breath, abdominal distention, chronic, had  a colonoscopy about a year ago or so.  No GI bleeding like symptoms.  No  syncope and no chest pain.  The rest is as above or negative.   PHYSICAL EXAMINATION:  VITAL SIGNS:  Temperature 98.8, blood pressure  184/92, heart rate 80, respirations 18, O2 saturation 96%.  ABDOMEN:  The abdomen is tender and distended.  No rebound symptoms.  Bowel  sounds decreased.  No organomegaly.  HEENT:  Dry mucosa.  NECK:  Supple.  No thyromegaly or bruits.  LUNGS:  Decreased breath sounds, no wheezes or rales.  HEART:  S1, S2, slight tachycardia.  No  gallop.  EXTREMITIES:  Lower extremities without edema.  Peripheral pulses symmetric  and normal.  NEUROLOGIC:  He is alert, oriented,  and cooperative.  He denies being  depressed.   LABORATORY DATA:  On September 30, 2002:  Sodium 136, potassium 3.5,  creatinine 0.8, lipase 28, amylase 78.  Today his white count is 14.1,  hemoglobin 15.1, platelets 202, sodium 138, potassium 3.4, INR 2.9, glucose  122, creatinine 0.9, calcium 8.8, AST 297, ALT 253.  Alkaline phosphatase  256, total bilirubin 5.1, amylase 2410, lipase 1357.   ASSESSMENT/PLAN:  1. Abdominal pain due to problem #2.  Will treat with IV morphine.  2. Pancreatitis, probable acute cholecystitis.  IV antibiotics.  Surgical     consult, obtain another ultrasound.  3. Elevated liver function tests due to problem #2.  Recheck tomorrow.  4.  Hypertension.  Will start therapy.  5. History of bladder cancer.  6. History of right renal cell cancer, seen by Dr. ________________.  7. Anticoagulation.  Will hold Coumadin.  Change to Lovenox.  8. Dehydration.  Will use IV fluids.                                                  Georgina Quint. Plotnikov, M.D. [facility]    AVP/MEDQ  D:  10/05/2002  T:  10/05/2002  Job:  [id number]   cc:   Jeannett Senior A. Clent Ridges, M.D. [facility]  [address] [place]  [place] [address]  Fax: 1   Valetta Fuller, M.D.   Barry Dienes Eloise Harman, M.D.   Angelia Mould. Derrell Lolling, M.D.  Fax: [phone]

## 2011-04-22 NOTE — Op Note (Signed)
NAME:  Bryan David, Bryan David NO.:  [id number]   MEDICAL RECORD NO.:  [id number]                   PATIENT TYPE:  AMB   LOCATION:  [facility]                                 FACILITY[facility]  [facility]   PHYSICIAN:  Valetta Fuller, M.D.               DATE OF BIRTH:  Nov 07, 1925   DATE OF PROCEDURE:  10/01/2003  DATE OF DISCHARGE:                                 OPERATIVE REPORT   PREOPERATIVE DIAGNOSES:  1. History of transitional cell carcinoma of the bladder.  2. History of renal cell carcinoma.  3. Recent positive urine cytology.   POSTOPERATIVE DIAGNOSES:  1. History of transitional cell carcinoma of the bladder.  2. History of renal cell carcinoma.  3. Recent positive urine cytology.   PROCEDURES PERFORMED:  1. Cystoscopy.  2. Bilateral retrograde pyelography.  3. Bladder biopsy of both lateral walls, trigone, and prostatic urethra,     with fulguration.   SURGEON:  Valetta Fuller, M.D.   ANESTHESIA:  General.   INDICATIONS:  Mr. 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  The patient  underwent a partial nephrectomy and has had no evidence of renal cell  recurrence.  He has had one recurrence with regard to his transitional cell  carcinoma.  He elected not to have BCG therapy initially.  More recently he  returned for cystoscopy, which showed no evidence of obvious recurrence.  An  NMP-22 cytology test was positive.  For that reason an IVP was obtained,  which was unremarkable.  Recent follow-up cystoscopy failed to reveal any  tumor but repeat urine cytology was positive, and he presents now for  reinspection of his urothelium.  He has no complaints today.  Of note, he  had a recent CT scan which showed no evidence of recurrent renal cell  carcinoma.   TECHNIQUE AND FINDINGS:  The patient was brought to the operating  room,  where he had successful induction of general anesthesia.  He was placed in  lithotomy position and prepped and draped in the usual manner.  Cystoscopy  revealed some mild to moderate trilobar hyperplasia.  The bladder showed a  couple of areas of erythema on the trigone but nothing really significant.  Careful retrograde pyelograms on both sides showed very normal-appearing  ureters and collecting systems without evidence of filling defect or  obstruction.  We elected to do random  biopsies of his bladder.  We took several in the trigone, where there was  some increased erythema.  We took left lateral and right lateral wall  biopsies as well as some biopsies in the prostatic fossa.  All these areas  were fulgurated.  The patient appeared to tolerate the procedure well, and  there were no obvious complications.  Valetta Fuller, M.D.    DSG/MEDQ  D:  10/01/2003  T:  10/01/2003  Job:  [id number]   cc:   Jeannett Senior A. Clent Ridges, M.D. [facility]

## 2011-04-22 NOTE — Op Note (Signed)
NAME:  Bryan David, Bryan David NO.:  [id number]   MEDICAL RECORD NO.:  [id number]          PATIENT TYPE:  AMB   LOCATION:  DAY                          FACILITY:  [facility]   PHYSICIAN:  Valetta Fuller, M.D.  DATE OF BIRTH:  19-Mar-1925   DATE OF PROCEDURE:  11/23/2005  DATE OF DISCHARGE:  11/23/2005                                 OPERATIVE REPORT   PREOPERATIVE DIAGNOSES:  1.  History of transitional cell carcinoma of the bladder.  2.  Recent positive urine cytology.  3.  Bladder neck obstruction.   POSTOPERATIVE DIAGNOSES:  1.  History of transitional cell carcinoma of the bladder.  2.  Recent positive urine cytology.  3.  Bladder neck obstruction.   PROCEDURE PERFORMED:  1.  Cystoscopy.  2.  Bilateral retrograde pyelography.  3.  Left ureteroscopy.  4.  Bladder biopsy with fulguration x2.  5.  Limited TURP.   SURGEON:  Dr. Isabel Caprice.   ANESTHESIA:  General.   INDICATIONS:  Mr. Albee is a 77 year old male.  The patient has a somewhat  complex history.  Back in 2002, the patient had presented with concurrent  right renal mass and transitional cell carcinoma of the bladder.  He  underwent a right partial nephrectomy with negative margins.  He has had  multiple follow ups and has never had any evidence of recurrent or  metastatic renal cell carcinoma.  The patient has had recurrent transitional  cell carcinoma of the bladder.  Initially, he had a moderate grade  noninvasive tumor.  He had a recurrence a year later which showed both a  high-grade as well as a low-grade component and was noninvasive.  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  The patient underwent 6 weeks  of BCG therapy.  Since that time, he has not had any evidence of obvious  recurrence on cystoscopy, but the patient has had a couple of situations  where he has had positive cytology and positive FISH.  The patient  has had a  couple trips to the operating room with multiple biopsies which have all  been negative.  He has had bladder washings which have shown a little bit of  atypia.  Upper tract studies including retrogrades as well as CTs with  contrast have failed to show anything significant.  The patient subsequently  had, again, recurrent positive cytology.  We thought that it could be  conceivably some transitional cell carcinoma involving the prostatic urethra  or into the prostatic ducts.  For that reason, we felt we ought to go back  for additional random biopsies, reassessment with retrograde pyelograms, and  resection of at least the bladder neck and a portion of his prostate to rule  out any transitional cell carcinoma in that location.   TECHNIQUE AND FINDINGS:  The patient was brought to the operating room where  he had successful induction of general anesthesia.  He was placed in  lithotomy position and prepped and draped in the  usual manner.  A cystoscopy  again revealed some trilobar hyperplasia with mild visual obstruction.  He  had a relatively open bladder neck, however.  There was some abnormality in  the mucosa right at the bladder neck.  This appeared to be more inflammatory  and appeared to be consistent with some cystitis cystica with a little bit  of bullous edema on that mucosa.  There was, however, some erythema, and  certainly carcinoma in situ cannot be completely ruled out.  There were also  a couple of little red patches on the lateral wall of his bladder and 1 on  the dome.  We started by going ahead with bilateral retrograde pyelography.   An 8-French cone-tip catheter was placed in the right ureteral orifice.  That ureter and collecting system appeared to be completely normal.  On the  left, there was an area of narrowing which persisted that was about 2-3 cm  above the ureteral vesicle junction.  The ureter and renal collecting system  was otherwise unremarkable.   Because of this continued area of narrowing, we  went ahead and put a guidewire up to the left renal pelvis and put in a  rigid ureteroscope.  This showed a little fold of mucosa with a little area  of narrowing, but I was able to drive the scope through this area, and  therefore  did not appear to be consistent with a stricture.  I saw no  mucosal abnormalities, and there was no evidence of transitional cell  carcinoma.   Attention was then turned to the bladder biopsies.  I took a cold cup biopsy  of the left lateral wall and the dome of the bladder, and those areas were  then fulgurated.  These, again, were areas where there was some slight  increased erythema.  The cystoscope was then removed.  We went ahead and put  in a continuous flow resectoscope.  With that, I resected circumferentially  around the bladder neck where there was some abnormal-appearing mucosa and  extend that resection into the lateral lobes on both sides.  This was to  adequately sample the prostate and most importantly the overlying mucosa as  well as the ductal system.  We also felt that this would benefit the patient  from obstructive status.  Approximately 5 grams of tissue was resected.  Those prostate chips were then sent separately.  At the completion of the  procedure, we placed a 22-French Foley catheter with a 30 mL balloon.  Urine  turned out to be quite clear at that point.  The patient tolerated the  procedure well with no obvious complications or problems.  He was brought to  the recovery room in stable condition.           ______________________________  Valetta Fuller, M.D.  Electronically Signed     DSG/MEDQ  D:  11/24/2005  T:  11/27/2005  Job:  [id number]

## 2011-04-22 NOTE — Discharge Summary (Signed)
[place]  Patient:    Bryan David, Bryan David Visit Number: [id number] MRN: [id number]          Service Type: MED Location: [phone] Attending Physician:  Judie Petit Dictated by:   Cornell Barman, P.A. Admit Date:  02/02/2002 Discharge Date: 02/14/2002   CC:         Dr. Derrell Lolling  Dr. Abran Cantor   Discharge Summary  BRIEF ADMISSION HISTORY:  Mr. Royster is a 77 year old white male who presented with the acute onset of severe abdominal pain localized to the epigastric area.  The patient had emesis x5.  PAST MEDICAL HISTORY: 1. Kidney and bladder cancer status post surgical resection in July 2002.    No adjunctive therapy. 2. Status post hernia surgery in 1997. 3. Gastroesophageal reflux disease with esophageal stricture.  HOSPITAL COURSE: #1 - GASTROINTESTINAL:  The patient presented with acute abdominal pain.  The patients amylase was elevated at 3009, consistent with pancreatitis.  The patient was admitted for bowel rest and pain control.  The patient underwent an ultrasound of the abdomen.  This revealed gallbladder sludge with a possible tiny gallstone without evidence of cholecystitis.  Dr. Cato Mulligan asked for a surgery consult and the patient was seen by Dr. Derrell Lolling who agreed acute pancreatitis was most likely secondary to biliary etiology although the ultrasound was nondiagnostic.  He recommended, at that point, proceeding with CT of the abdomen and pelvis.  This did, again, show mild changes of acute pancreatitis without complication.  The gallbladder was noted to be contracted.  In anticipation of surgery, Dr. Derrell Lolling requested a cardiolite to evaluate the patients dyspnea on exertion.  The patient had a cardiolite on February 05, 2002 that was negative for ischemia.  The patients pancreatitis did continue to improve biochemically.  LFTs began normalizing.  Symptomatically, the patient was improved and he was slowly started on oral diet and  advanced as tolerated.  Dr. Derrell Lolling felt a cholecystectomy should be deferred until 4-6 weeks out.  #2 - DYSPNEA ON EXERTION:  As noted, the patient had a cardiolite that was negative for ischemia.  We also performed a 2-D echo that revealed overall left ventricular size to be normal with preserved LV function.  There were no wall motion abnormalities noted.  The patient has probable underlying COPD and was treated with nebulizer treatments with improvement in his bronchospasm. For completeness, we did order a spiral CT of the chest, which was positive. The patient was found to have pulmonary embolus to the right middle lobe in addition to emphysema.  The patient was then started on Lovenox and Coumadin for anticoagulation.  The patient has been anticoagulated and was felt to be stable for discharge home.  As noted, the patient will follow up with Dr. Derrell Lolling for an elective cholecystectomy.  LABORATORY AT DISCHARGE:  Protime was 19.2, INR was 1.9, hemoglobin 13.6, hematocrit 41, potassium 4.2.  Stool for C. difficile was negative.  MEDICATIONS AT DISCHARGE: 1. Nexium 40 mg q.d. 2. Coumadin; final dose to be determined by pharmacy.  FOLLOWUP:  The patient will follow up at Dr. Gerrit Halls office Friday, March 14 for a protime, and then follow up with Dr. Abran Cantor in the next 2-3 weeks.  He is to follow up with Dr. Derrell Lolling in three weeks to discuss an elective cholecystectomy. Dictated by:   Cornell Barman, P.A. Attending Physician:  Judie Petit DD:  02/14/02 TD:  02/15/02 Job: [id number]

## 2011-04-22 NOTE — Op Note (Signed)
NAME:  Bryan David, Bryan David NO.:  [id number]   MEDICAL RECORD NO.:  [id number]          PATIENT TYPE:  AMB   LOCATION:  [facility]                         FACILITY:  [facility]   PHYSICIAN:  Valetta Fuller, M.D.  DATE OF BIRTH:  September 06, 1925   DATE OF PROCEDURE:  09/01/2004  DATE OF DISCHARGE:                                 OPERATIVE REPORT   PREOPERATIVE DIAGNOSES:  1.  History of urothelial carcinoma.  2.  Positive urinary cytology.   POSTOPERATIVE DIAGNOSES:  1.  History of urothelial carcinoma.  2.  Positive urinary cytology.   PROCEDURES PERFORMED:  1.  Cystoscopy.  2.  Random bladder biopsies.  3.  Prostatic urethral biopsy.  4.  Transurethral incision of the prostate.  5.  Bilateral retrograde ureteropyelography with interpretation.   SURGEON:  Valetta Fuller, M.D.   RESIDENT SURGEON:  Thyra Breed, MD   ANESTHESIA:  General endotracheal, laryngeal mask airway.   DRAINS:  86 French [place]cill tip catheter to straight drain.   COMPLICATIONS:  None.   INDICATIONS FOR PROCEDURE:  Mr. Lonigro is a 77 year old male who was  discovered in 2002 to have a renal cell carcinoma of the right kidney as  well as concurrent transitional cell of the bladder.  The patient underwent  a partial nephrectomy with no evidence of disease recurrence from this.  With regard to the transitional cell carcinoma, he elected not to proceed  with BCG therapy initially.  The patient has had a positive NMP22 since that  time and biopsies consistent with carcinoma in situ.  He has also had some  high-grade noninvasive tumor which was multifocal.  He received mitomycin C.  He has now received BCG therapy as well.  On this last office appointment,  he was noted to have a positive FISH as well as urinary cytology.  It is  explained to him at this time that he needs to come to the operating room  for a formal evaluation including random bladder biopsies, biopsies of the  prostatic urethra, as well  as evaluation of his upper tracts with retrograde  pyelography.  The patient understands all the risks, benefits, and  alternatives of the procedure and is willing to proceed.   PROCEDURE IN DETAIL:  Following  identification by his arm bracelet, the  patient was brought to the operating room and placed in the supine position.  Here, he underwent successful induction of laryngeal mask airway anesthesia  and received preoperative IV antibiotics.  He was then moved to the dorsal  lithotomy position.  We initially placed a 12-degree cystoscopic lens  through a 22.5 French sheath per urethra.  This revealed a normal-appearing  anterior urethra.  Once in the posterior urethra, the proximal prostatic  urethra near the bladder neck showed some adhesions of the lateral lobes  posteriorly, possibly from previous instrumentation.  There was significant  bilobar hypertrophy by the lateral lobes.  Upon entry into the bladder, both  the right and the left ureteral orifice were in their normal anatomic  location and seemed to be effluxing clear urine.  The bladder was 2+  trabeculated.  Further cystoscopic examination with both the 12 and the 70-  degree cystoscopic lens revealed some previous resection sites on the left  posterolateral wall.  There was some erythematous-appearing mucosa  posteriorly  Around the bladder neck, especially in the 6 to 9 o'clock  position, there was some questionable tissue which appeared to be more like  bullous edema.  Further cystoscopic exam revealed no evidence of foreign  bodies, stones, or papillary lesions.   At this time, we drained the bladder and assembled the rigid biopsy forceps.  The posterior bladder wall area of erythema was then biopsied with the rigid  cold cup biopsy device.  This was repeated on both the right and left  lateral wall.  The Bugbee device was then used to used to fulgurate any  bleeding caused within the defects.  There was no evidence of  bladder  perforation.  At this time, we placed a cone-tip 6 French catheter through  the working port of the cystoscope and gently cannulated the left ureteral  orifice. Retrograde ureteropyelography was performed on the left-sided  collecting system which revealed no evidence of mucosal irregularities or  filling defects upon interpretation in the operating room.  Retrograde  ureteropyelography was then repeated on the right with interpretation, and  this revealed no evidence of filling defects or mucosal irregularities  within the right-sided collecting system.  Of note, both collecting systems  were rather delicate and small in appearance.  At this time, we assembled  the resectoscope.  The resectoscope sheath of 28 French was then placed with  the assistance of the obturator.  Continuous flow using glycine as an  irrigant was instituted.  The resectoscope was then used to resect these  areas of bullous-appearing edema at the bladder neck.  In addition, portions  of these lateral lobes proximal to the bladder neck were also resected.  The  previously-mentioned areas of adhesion on the posterior aspect of the  lateral lobes entering the floor of the bladder was then resected in its  entirety with the resectoscope.  In essence, we performed TUIP.  Likely,  this would improve the patient's voiding.  The verumontanum was well away  from the areas of resection.  All bleeding was then fulgurated using the  coagulation setting on the resectoscope.  Flow was turned off, and there was  some minimal venous bleeding from the large lateral lobes.  However, there  was no evidence of active bleeding.  The bladder neck was minimally elevated  from the resection.  Therefore, given the numerous biopsies, we elected to  place a Foley catheter to leave in place for approximately three days.  A catheter guide was then utilized to place a 20 [place] Foley catheter without  difficulty.  Thirty cc were used to  fill the balloon.  The catheter was  irrigated, and urine was found to be clear.  The patient tolerated the  procedure well, and there were no complications.  Please note that Dr.  Isabel Caprice was present and participated in the entire procedure, as he was the  responsible surgeon.   DISPOSITION:  After waking from general anesthesia, the patient was  transferred to the postanesthesia care unit in stable condition.  From here,  he will be transferred to home.  He will follow up in approximately three  days for removal of his Foley catheter.  He is given a prescription for  Vicodin and Cipro.  EG/MEDQ  D:  09/01/2004  T:  09/02/2004  Job:  [id number]

## 2011-04-22 NOTE — Op Note (Signed)
[facility]  Patient:    Bryan David, Bryan David                        MRN: [id number] Proc. Date: 06/13/01 Adm. Date:  [id number] Disc. Date: [id number] Attending:  Mardella Layman CC:         Vania Rea. Jarold Motto, M.D. [facility]   Operative Report  PREOPERATIVE DIAGNOSES:  1. Renal cell carcinoma.  2. Bladder tumor.  3. Bladder neck obstruction from BPH.  POSTOPERATIVE DIAGNOSES:  1. Renal cell carcinoma.  2. Bladder tumor.  3. Bladder neck obstruction from BPH.  PROCEDURE:  Right flank exploration, right partial nephrectomy, cystoscopy, transurethral resection of prostate, transurethral resection of bladder tumor.  SURGEON:  Barron Alvine, M.D.  ASSISTANT:  Lindaann Slough, M.D.  ANESTHESIA:  General.  INDICATIONS FOR PROCEDURE:  Bryan David is a 77 year old male who was sent over for evaluation of a symptomatic microscopic hematuria. The patient was noted to have a solid right renal mass on ultrasound which was confirmed on CT scan to be an enhancing solid lesion that was 3 cm in the mid pole of the right kidney. This was consistent with a small renal cell carcinoma. There was no evidence of advanced metastatic disease. On cystoscopy, the patient had small to medium size middle lobe of his prostate causing visual bladder neck obstruction. The patient did have significant voiding symptoms that have failed to respond to Flomax. In addition, along the lateral aspect of the bladder wall there was a papillary transitional cell carcinoma. We thought that given the location of the tumor that resection of part or all of the middle lobe of the prostate may be necessary to gain better access to the tumor. The patient underwent extensive counseling with regard to all these findings and we recommended a flank exploration with attempt at partial nephrectomy with knowledge of radical nephrectomy may be necessary. We then plan on performing transurethral resection of  prostate to access the tumor and then transurethral resection of bladder tumor. Full and informed consent was obtained. The patient now presents for this procedure.  TECHNIQUE AND FINDINGS:  The patient was brought to the operating room where he had successful induction of general endotracheal anesthesia. We placed him in a standard flank position. An axillary roll was utilized as were compression boots. All extremities were padded carefully and positioning was done in the normal manner. A Foley catheter had been inserted. The patient was then prepped and draped in the usual manner. A standard flank twelfth rib incision was performed. We did take a small piece of the twelfth rib. The retroperitoneal space was entered and the kidney was identified. This was done extraperitoneally. A self retaining retractor with the omni was utilized. We were able to palpate the mass on the lateral surface of the kidney at the mid pole. We did go ahead and identify the area of the hilum but did not actually dissect out the vasculature because we did not feel that arterial occlusion was going to be necessary for this case but did want to have a general sense of the location of the hilum and therefore the renal vein was identified with typical Kocher maneuver exposing the duodenum in the underlying cava. We then freed up the upper pole of the kidney so that we would have access to the kidney. We went ahead and opened Gerotas fascia and circled the area around the tumor leaving the fat and Gerotas fascia overlying the  actual tumor. The tumor itself was approximately 3 cm in diameter. It was partially exophytic. Once we had circumscribed the area of the tumor, we then utilized the cautery to score the renal capsule circumferentially around the tumor. Utilizing the blunt knife handle, we were able to dissect out the tumor itself with a margin of normal parenchyma. We inspected the tumor and there was no evidence  that we had transected tumor. There were some bleeders from the base that were handled with some direct pressure as well as an argon beam photo coagulator. We then used Gelfoam soaked with fibrin glue as a patch into the defect. With pressure, all the bleeding stopped. We then reapproximated fat overlying the nephrectomy defect but large bolstered sutures were not necessary. Estimated blood loss for this portion of the procedure was approximately 300 cc. The patient remained stable and did quite well. Urine remained clear and did not become bloody at all. We felt there was on need for drainage. The wound was copiously irrigated and the flank incision was then closed in a standard manner with three layers individually anteriorly and two posteriorly with #1 PDS suture. The skin was closed with clips.  Dr. Brunilda Payor who assisted during the flank incision then left the room. The patient was repositioned in the supine position in moderate lithotomy. We went ahead with cystoscopy and confirmed the presence of a significant middle lobe as well as a lateral bladder tumor in the lateral aspect of the trigone but away from the ureteral orifice. A 28.5 French continuous flow resectoscope was then used. We ended up resecting the middle lobe to better access the tumor and also provide some relief for the patients bladder neck obstruction. We did not do a full TURP and did not resect the lateral lobes. The middle lobe was resected down the capsular fibers of the bladder neck which really opened up the bladder neck quite nicely. The prostate chips were sent separately for pathologic evaluation. The bladder tumor itself was approximately 2-3 cm in size and was evolving through the junction of the trigone and left lateral wall of the bladder. The orifice was a centimeter or two away from the tumor. This appeared to be a low stage well differentiated papillary transitional cell carcinoma. This was resected and  pathology was sent separately. At the completion of this, a 74 [place] three-way Foley catheter was placed. Urine was  actually quite clear. The patient was brought to the recovery room in stable condition. DD:  06/13/01 TD:  06/13/01 Job: [id number]

## 2011-04-22 NOTE — Discharge Summary (Signed)
[facility]  Patient:    Bryan David, Bryan David                        MRN: [id number] Adm. Date:  [id number] Disc. Date: [id number] Attending:  Thermon Leyland CC:         Vania Rea. Jarold Motto, M.D. [facility]   Discharge Summary  DISCHARGE DIAGNOSES: 1. Malignant neoplasm of the kidney. 2. Malignant neoplasm of the bladder. 3. Microscopic hematuria. 4. Benign prostatic hypertrophy.  PROCEDURES PERFORMED: 1. Right partial nephrectomy. 2. Transurethral resection of bladder tumor. 3. Transurethral resection of the prostate.  HOSPITAL COURSE:  [name] is a 77 year old male.  He had presented with some mild voiding symptoms and microscopic hematuria.  Evaluation revealed a solid lesion in the right kidney which, on CT scan, showed enhancement and was consistent with a 3 to 4 cm renal cell carcinoma.  Cystoscopy revealed a middle lobe of the prostate causing visual obstruction and a bladder tumor, as well.  The patients prostate nodule/prostate lobe appeared to be blocking access to the bladder tumor and, therefore, the patient presented for a combined procedure of partial nephrectomy along with a transurethral resection of the prostate, middle lobe to access the bladder tumor for its resection, as well.  On June 13, 2001, the patient underwent his surgical procedure.  We initiated this procedure with a right extraperitoneal flank incision.  A solid lesion was noted in the right lateral aspect of the kidney.  A standard partial nephrectomy was performed without significant blood loss or difficulty.  Margins of this tumor were negative.  The final pathology revealed a nuclear grade 4/5, 3.7 cm renal cell carcinoma.  Margins were negative.  After that portion, the patient underwent a transurethral resection.  Again, he had a bladder tumor.  This was involving the left trigone/left lateral wall of the bladder.  The orifice was not involved.  The middle lobe of the  prostate was resected, but we did not resect the lateral lobes.  With better access towards the trigone and left lateral wall of the bladder, we then resected the bladder tumor.  The bladder tumor was indeed a transitional cell carcinoma with a nuclear grade 2/3.  There was no evidence of invasion.  The prostate showed only BPH and chronic inflammation.  The patients postoperative course was relatively unremarkable. He did have a bit of an ileus that took several days to resolve.  His postoperative labs revealed a hemoglobin of 11.7.  His creatinine remained stable at 1.0.  An indwelling Foley catheter was left until postoperative day #5.  He was eventually discharged on June 18, 2001.  At that time, he was voiding okay without his Foley catheter.  He was tolerating a general diet well and had had resolution of his ileus.  He was discharged by Dr. Patsi Sears who was covering in my absence.  DISPOSITION:  The patient was discharged to home.  He was given a prescription for several days of Cipro as well as some Tylox for pain.  He will be presenting to our office on June 20, 2001, for staple removal. DD:  06/25/01 TD:  06/25/01 Job: [id number]

## 2011-04-22 NOTE — Op Note (Signed)
[facility]  Patient:    SAFWAN, TOMEI Visit Number: [id number] MRN: [id number]          Service Type: DSU Location: DAY Attending Physician:  Thermon Leyland Dictated by:   Barron Alvine, M.D. Proc. Date: 04/17/02 Admit Date:  04/17/2002                             Operative Report  PREOPERATIVE DIAGNOSIS:  Recurrent transitional cell carcinoma of the bladder.  POSTOPERATIVE DIAGNOSIS:  Recurrent transitional cell carcinoma of the bladder.  PROCEDURE:  Cystoscopy, bladder biopsy x2 with fulguration.  SURGEON:  Barron Alvine, M.D.  ANESTHESIA:  General.  INDICATIONS FOR PROCEDURE:  Mr. Dunlow is a 77 year old male. He has a history of superficial transitional cell carcinoma as well as history of a renal cell carcinoma. He is status post a partial nephrectomy and TURBT for bladder tumor. His last couple of follow-ups have been unremarkable with regard to his bladder. Recently cystoscopy revealed two very small recurrences. Both of these were exophytic papillary tumors which appear to be superficial and well differentiated measuring about 4-5 mm in size, one in the dome of his bladder and one in the posterior wall. He presents now to have these cold cut and fulgurated. The patient is on Coumadin but given the very small size of these lesions, we did not feel that discontinuation of the anticoagulant was necessary. We also plan on instilling mitomycin in his bladder posteriorly to decrease the risk of recurrence.  TECHNIQUE AND FINDINGS:  The patient was brought to the operating room where he had successful induction of general anesthesia. He was placed in lithotomy position, prepped and draped in the usual manner. Cystoscopy again confirmed two very small transitional cell carcinomas, one in the dome and one in the posterior wall. These were both cold cut with a single biopsy and fulgurated. No bleeding was really encountered. At the  completion of the procedure, we placed a Foley catheter and put 30 mg of mitomycin C in his bladder which will be left indwelling for about an hour. The patient was brought to the recovery room in stable condition. Dictated by:   Barron Alvine, M.D. Attending Physician:  Thermon Leyland DD:  04/17/02 TD:  04/18/02 Job: [id number]

## 2011-04-22 NOTE — H&P (Signed)
Bryan David. [facility]  Patient:    Bryan, David Visit Number: [id number] MRN: [id number]          Service Type: MED Location: 1800 1823 01 Attending Physician:  Donnetta Hutching Dictated by:   Valetta Mole Swords, M.D. [facility] Admit Date:  02/02/2002   CC:         Jeannett Senior A. Clent Ridges, M.D.  [facility])  Vania Rea. Jarold Motto, M.D. [facility]  Barron Alvine, M.D.   History and Physical  CHIEF COMPLAINT: Abdominal pain.  HISTORY OF PRESENT ILLNESS: Bryan David is a 77 year old gentleman, who was in his usual state of good health until 3 p.m. today.  At that time he developed severe abdominal pain, location epigastric area.  Associated symptoms emesis x5.  Modifying factors: None.  Duration two hours of intense pain, gradually lessened.  He currently feels well without any abdominal pain.  He denies any GI blood loss.  No hematemesis.  PAST MEDICAL HISTORY:  1. Kidney and bladder cancer.  Had surgery for both in July 2002.  No     adjunctive therapy was recommended.  2. Hernia surgery in 1997.  3. History of gastroesophageal reflux disease with esophageal stricture.  MEDICATIONS:  1. Nexium.  2. Aspirin.  FAMILY HISTORY: Noncontributory.  SOCIAL HISTORY: He is married.  He is an ex-smoker, quit in 1981.  Does not drink any alcohol.  REVIEW OF SYSTEMS: He denies any chest pain, shortness of breath, PND, orthopnea, lower extremity edema, rashes, or any other complaints except he does note when he walks greater than 200 yards some dyspnea.  Denies any chest pain with exertion.  No other complaints on Review Of Systems.  PHYSICAL EXAMINATION:  VITAL SIGNS: On admission to the emergency department temperature 97.3 degrees, respirations 20, heart rate 68, blood pressure 175/83.  GENERAL: Well-developed, well-nourished, elderly male in no acute distress.  HEENT: Atraumatic, normocephalic.  EOMI.  Anicteric.  NECK: Supple without lymphadenopathy,  thyromegaly, jugular venous distention, or carotid bruits.  CHEST: Clear to auscultation.  CARDIAC: S1 and S2 normal without murmurs or gallops.  ABDOMEN: Active bowel sounds, soft, nondistended, no masses.  To deep palpation there is some tenderness in the epigastric area.  No rebound or guarding tenderness.  RECTAL: Examination not performed.  EXTREMITIES: No clubbing, cyanosis, or edema.  NEUROLOGIC: He is alert and oriented without any motor or sensory deficits.  LABORATORY DATA: Amylase 3009.  CBC with WBC of 12.9, hemoglobin 15.7, platelet count 147,000.  CK total 175, CK-MB 4.0, relative index 2.3, troponin I 0.02.  Urinalysis unremarkable.  CMET normal except for a total bilirubin of 1.4, AST 246, ALT 187, glucose 124.  UA demonstrates 7-10 rbc/hpf.  Lipase is pending.  EKG demonstrates normal sinus rhythm with incomplete right bundle branch block.  ASSESSMENT/PLAN:  1. Pancreatitis, likely gallstone pancreatitis.  He will need further     evaluation.  Elevated liver function tests are likely secondary to     pancreatitis.  Will follow serial amylase and lipase.  Will keep him NPO     for now except for ice chips.  Will continue intravenous fluids.  Will     obtain an abdominal ultrasound.  2. Hyperlipidemia, mild.  Will follow serial capillary blood glucoses for     one day and hemoglobin A1C.  3. Elevated liver function tests, as above.  4. History of "kidney cancer and bladder cancer".  5. History of gastroesophageal reflux disease.  Will use intravenous  Protonix.  6. Mild thrombocytopenia.  No further evaluation necessary.Dictated by: Valetta Mole Swords, M.D. [facility] Attending Physician:  Donnetta Hutching DD:  02/02/02 TD:  02/03/02 Job: [id number]

## 2011-04-22 NOTE — Op Note (Signed)
NAME:  Bryan David, Bryan David NO.:  [id number]   MEDICAL RECORD NO.:  [id number]                   PATIENT TYPE:  INP   LOCATION:  0452                                 FACILITY:  [facility]   PHYSICIAN:  Angelia Mould. Derrell Lolling, M.D.             DATE OF BIRTH:  [birth year]   DATE OF PROCEDURE:  10/10/2002  DATE OF DISCHARGE:                                 OPERATIVE REPORT   PREOPERATIVE DIAGNOSIS:  Biliary pancreatitis.   POSTOPERATIVE DIAGNOSES:  1. Biliary pancreatitis.  2. Choledocholithiasis.  3. Severe chronic cholecystitis with cholelithiasis.   OPERATION PERFORMED:  Laparoscopic cholecystectomy with intraoperative  cholangiogram.   SURGEON:  Angelia Mould. Derrell Lolling, M.D.   FIRST ASSISTANT:  Abigail Miyamoto, M.D.   INDICATIONS FOR PROCEDURE:  This is a 77 year old white man who was admitted  to this hospital on October 05, 2002 with abdominal pain. He also had nausea  and vomiting and came to the emergency room and was found to have  significant elevation of amylase and liver function tests consistent with  gallstone pancreatitis. Past medical history significant for prior episodes  of gallbladder pancreatitis in early 2003 but that hospitalization was  complicated by acute pulmonary emboli before he could undergo  cholecystectomy and so that was put off. He has been on Coumadin over the  past six months, has been followed by Dr. Gershon Crane and has been  relatively stable until just this week.   PAST MEDICAL HISTORY:  1. Carcinoma of the bladder, status post TUR bladder tumor.  2. Carcinoma of the right kidney, status post right partial nephrectomy.  3. Remarkable for pulmonary emboli.  4. Gastroesophageal reflux disease.   SOCIAL HISTORY:  He is married. Denies alcohol. Remote history of smoking.   PHYSICAL EXAMINATION:  Thin, elderly, white man in no distress. Skin was  warm and dry. Neck no bruits, no mass. Lungs clear. Heart regular rate and  rhythm. Abdomen soft with some mild to moderate central tenderness in the  epigastrium and central abdomen but no guarding, mass or peritoneal signs.  Extremities no edema, well vascularized.   ADMISSION DATA:  Total bilirubin 5.1, white blood cell count 14,100,  alkaline phosphatase 256, hemoglobin 15.1. Prothrombin time 25.9 seconds,  INR 2.9. Ultrasound shows gallstones.   We let his pancreatitis settle down and correct his coagulopathy and he is  brought to the operating room semi-electively.   OPERATIVE FINDINGS:  The gallbladder was very thick walled with extensive  omental adhesions to it. This was very tedious dissection, took about 30  minutes to get all the adhesions down but visualization was good. Anatomy of  the cystic duct, cystic artery and common bile duct were conventional. The  cholangiogram showed probably two small filling defects in the mid common  bile duct that were persistent on multiple films. The intrahepatic and  extrahepatic bile ducts otherwise looked normal.  There was prompt flow of  contrast into the duodenum. The liver, stomach and duodenum otherwise looked  normal. The large intestines and small intestines were grossly normal  although some were distended. There were some adhesions in the right flank  from his previous nephrectomy.   OPERATIVE TECHNIQUE:  Following the induction of general endotracheal  anesthesia, the patient's abdomen was prepped and draped in a sterile  fashion. The 0.5% Marcaine with epinephrine was used as a local infiltration  anesthetic. A transverse incision was made at the lower end of the  umbilicus. The fascia was incised in the midline and the abdominal cavity  entered under direct vision. A 10 mm Hasson trocar was inserted and secured  with a pursestring suture of #0 Vicryl. Pneumoperitoneum was created. A  video camera was inserted with visualization and findings as described  above. A 10 mm trocar was placed in the  subxiphoid region and two 5 mm  trocars placed in the right mid abdomen. We identified the fundus of the  gallbladder and elevated that. We spent about 20 minutes taking adhesions  down off of the gallbladder but we kept the dissection exactly in the plane  of the gallbladder serosa and so that dissection went well. Ultimately we  were able to identify the infundibulum of the gallbladder and dissect out  the cystic duct and cystic artery. We dissected these very carefully because  of the inflammation. When we felt we had found the cystic artery going onto  the gallbladder wall, we secured it with metal clips and divided it. We then  had a large window behind the cystic duct. We secured the cystic duct with a  metal clip. We placed a cholangiogram catheter in the cystic duct. A  cholangiogram was obtained. This showed normal intrahepatic and extrahepatic  bile ducts with good flow of contrast in the duodenum; however, there were a  couple of small filling defects in the mid common bile duct. The cystic duct  had a very narrow lumen and had lots of valves and we did not feel we could  explore this properly. The cholangiogram catheter was removed. The cystic  duct was secured with metal clips and divided. The gallbladder was dissected  from its bed with electrocautery, placed in the specimen bag and removed  through the umbilical port. The operative field was copiously irrigated with  saline. Hemostasis was excellent and achieved with electrocautery. There was  no bleeding and no bile leak whatsoever at the completion of the case. The  trocars were removed under direct vision and there was no bleeding from the  trocar sites. Pneumoperitoneum was released. The fascia at the umbilicus was  closed with #0 Vicryl sutures. The skin incisions were closed with  subcuticular sutures of 4-0 Vicryl and Steri-Strips. Clean bandages were  placed and the patient taken to the recovery room in stable  condition. Estimated blood loss was  about 30 cc, complications none. Sponge, needle and instrument counts were  correct. Dr. Stan Head has been asked to see the patient regarding  postoperative ERCP.                                                 Angelia Mould. Derrell Lolling, M.D.    HMI/MEDQ  D:  10/10/2002  T:  10/10/2002  Job:  [id number]   cc:  Tera Mater. Clent Ridges, M.D. [facility]  [address] [place]  [place] [address]  Fax: 1   Georgina Quint. Plotnikov, M.D. [facility]   Iva Boop, M.D. [facility]  [address] [place], [place] [address]  Fax: 1

## 2011-04-22 NOTE — Consult Note (Signed)
NAME:  Bryan David, STAI NO.:  [id number]   MEDICAL RECORD NO.:  [id number]                   PATIENT TYPE:  INP   LOCATION:  1610                                 FACILITY:  [facility]   PHYSICIAN:  Iva Boop, M.D. [facility]           DATE OF BIRTH:  05-16-1925   DATE OF CONSULTATION:  10/10/2002  DATE OF DISCHARGE:                                   CONSULTATION   REQUESTING PHYSICIAN:  Angelia Mould. Derrell Lolling, M.D.   PRIMARY CARE PHYSICIAN:  Jeannett Senior A. Clent Ridges, M.D.   CHIEF COMPLAINT/REASON FOR CONSULTATION:  Retained common duct stones.   HISTORY OF PRESENT ILLNESS:  This is a pleasant 77 year old white male that  was admitted with nausea, vomiting, and abdominal pain which turned out to  be biliary pancreatitis.  He subsequently improved.  His INR was elevated at  the time of admission.  Once he improved and his INR normalized, he was  taken to the operating room today for cholecystectomy.  This was  accomplished, but intraoperative cholangiogram demonstrated two probable  retained common duct stones.  The patient is recovering in the PACU at this  point without any significant postoperative difficulty.  His nausea,  vomiting, and abdominal pain all had improved significantly while he was  hospitalized.  His GI history is otherwise notable for gastroesophageal  reflux disease and a history of strictures.   PAST MEDICAL HISTORY:  1. Past history of pulmonary embolism with subsequent anticoagulation in the     spring 2003, and at that time he actually had biliary pancreatitis, but     intervention was not undertaken because of his need for anticoagulation.  2. History of right renal cell carcinoma.  3. Bladder cancer.  4. Hernia repair.   MEDICATIONS:  1. Protonix 40 mg IV q.d.  2. Cardizem CD 120 mg q.d.  3. Unasyn 3 g IV q.6h.   ALLERGIES:  No known drug allergies.   SOCIAL HISTORY:  Married, no alcohol, remote smoker.   FAMILY HISTORY:  Positive for  coronary artery disease.   REVIEW OF SYMPTOMS:  He has had some shortness of breath, no chest pain.  He  wears upper dentures.  He is deaf in the left ear.  He has joint pain in the  hands, shoulders, and back.  Has some insomnia as well.  Believes he has  lost some weight as well.  All other systems appear negative at this time.   PHYSICAL EXAMINATION:  GENERAL:  A well-developed, well-nourished, elderly  white male laying in bed in the PACU at this point.  VITAL SIGNS:  Blood pressure 160/70, pulse 65, temperature 98.3,  respirations 19.  HEENT:  Eyes are anicteric.  Mouth is edentulous, free of oral posterior  pharyngeal mucosal lesions.  NECK:  Supple without mass or thyromegaly.  CHEST:  Clear anteriorly.  HEART:  S1 and S2, no murmurs,  rubs, or gallops are heard.  No JVD.  ABDOMEN:  Soft.  There is appropriate postoperative tenderness.  At this  time, bowel sounds are diminished.  No obvious mass.  SKIN:  Without jaundice or rash.  LYMPH NODES:  No neck or groin adenopathy.  NEUROLOGIC:  He answers questions appropriately.  EXTREMITIES:  Without cyanosis, clubbing, or edema.  There are no gross  joint deformities of the hands.  Peripheral pulses in the extremities are  intact.   LABORATORY DATA:  Bilirubin 1.3, alkaline phosphatase 145 today.  On  10/08/02, AST was 39, ALT 75, alkaline phosphatase 141.  Lipase was 60.  Creatinine has been normal throughout the hospital course.  Electrolytes  normal.  Albumin low at 2.6.  The patient has had some diarrhea not  mentioned above.  Negative C. diff toxin.  Stool for occult blood has been  negative.  Stool for white cells has been negative.  White count 11,600,  hemoglobin 13.4, potassium 4.3 today.   Intraoperative cholangiogram per Dr. Jacinto Halim report shows  choledocholithiasis.  Previous ultrasound showed cholelithiasis with  thickened gallbladder wall consistent with cholecystitis and a renal cyst.   ASSESSMENT:  1.  Choledocholithiasis after a laparoscopic cholecystectomy.  2. Biliary pancreatitis, improving.  3. History of reflux disease with stricture, apparently no dysphagia at this     time.   PLAN:  For endoscopic retrograde cholangiopancreatography tomorrow.  I have  explained the risks, benefits, indications to the patient, he understands  and agrees to proceed.   I appreciate the opportunity to care for this patient.                                                Iva Boop, M.D. [facility]    CEG/MEDQ  D:  10/10/2002  T:  10/10/2002  Job:  [id number]   cc:   Jeannett Senior A. Clent Ridges, M.D. [facility]  [address] [place]  [place] [address]  Fax: 1   Angelia Mould. Derrell Lolling, M.D.  [address]  [place]  [place] [address]  Fax: [phone]

## 2011-04-22 NOTE — Consult Note (Signed)
[place]  Patient:    Bryan David, Bryan David Visit Number: [id number] MRN: [id number]          Service Type: MED Location: [id number] Attending Physician:  Judie Petit Dictated by:   Angelia Mould. Derrell Lolling, M.D. Proc. Date: 02/04/02 Admit Date:  02/02/2002   CC:         Bruce H. Swords, M.D. [facility]   Consultation Report  REASON FOR CONSULTATION:  Evaluate pancreatitis.  HISTORY OF PRESENT ILLNESS:  This is a 77 year old white man who was admitted on February 02, 2002.  He had what is apparently his first episode of severe epigastric pain and repeated episodes of vomiting.  The pain got much better a few hours later after he got to the emergency room and got some analgesics. He still has a little bit of pain, but not much.  He has been tolerating a clear liquid diet.  On admission he was found to have a serum amylase of 3009 which has now come down a great deal.  His hemoglobin is 15.7.  He had elevated SGOT and SGPT which have been getting somewhat better.  Cardiac enzymes were negative.  He had an ultrasound which shows "sludge" in the gallbladder, but is not diagnostic for gallstones.  There is no gallbladder wall thickening.  Common bile duct is okay.  There is a 1.9 cm hypoechoic lesion in the right kidney.  PAST MEDICAL HISTORY:  The patient had a right partial nephrectomy and some type of bladder surgery for neoplasia by Dr. Barron Alvine in July 2002 and has done well since that time.  He had hernia surgery in 1997.  He had gastroesophageal reflux disease with esophageal stricture, possibly dilated in the past.  CURRENT MEDICATIONS:  Nexium and aspirin.  ALLERGIES:  TYLENOL.  SOCIAL HISTORY:  He does have a long history of tobacco abuse.  He does not smoke currently.  He has never had any alcohol intake.  He is retired from [employer], now a Engineer, technical sales.  FAMILY HISTORY:  Mother is living age  75.  Father living age 71.  Two sisters living and one brother.  REVIEW OF SYSTEMS:  Was told he had hypertension many years ago, now uses Lasix p.r.n.  Does have dyspnea on exertion, but denies chest pain.  Does have a remote history of tobacco abuse, but none recently.  PHYSICAL EXAMINATION  GENERAL:  Fairly pleasant, healthy appearing older gentleman in no distress.  VITAL SIGNS:  Temperature 99.9, heart rate 78, respirations 18, blood pressure 152/85.  HEENT:  Sclerae:  Clear.  Extraocular movements are intact.  NECK:  Supple, nontender.  No adenopathy.  No thyromegaly.  No bruit.  LUNGS:  Clear to auscultation.  HEART:  Regular rate and rhythm.  No murmur.  ABDOMEN:  Soft but he is tender in the epigastrium.  There is no guarding, no mass.  There is a well healed right flank scar from his previous kidney surgery.  EXTREMITIES:  No edema.  Good pulses.  IMPRESSION: 1. Acute pancreatitis, now resolving slowly, but without complication.    Biliary etiology is most likely due to the absence of alcohol use in the    past.  His ultrasound is not diagnostic of gallstones but he might have    some stones that is being interpreted as sludge. 2. A 1.9 cm "echogenic lesion" right kidney.  I question whether this might be    scar tissue  from his previous kidney surgery, although I cannot rule out    new tumor. 3. Dyspnea on exertion.  Question whether this is from chronic obstructive    pulmonary disease or whether he has underlying coronary disease.  PLAN:  I advised the patient of the nondiagnostic nature of his ultrasound, but advised him that almost certainly he has a biliary etiology to his pancreatitis and that elective cholecystectomy would be a reasonable thing to offer him.  I discussed the indications and details of the surgery with him. Risks and complications were outlined including, but not limited to, bleeding, infection, bile duct injury, injury to adjacent organs,  and other unforeseen problems.  He seems to understand these issues well and at this time all of his questions are answered.  He would like to have this surgery but he would like to get over his current problem and go home for two or three weeks and have the surgery electively which I think is reasonable.  In the interim the patient is going to have a Cardiolite to complete his cardiac work-up by Dr. Cato Mulligan.  I would also ask Dr. Barron Alvine to review the patients ultrasound to see if this "lesion" on his right kidney is simply the old scar or whether further work-up would be indicated.  I will follow the patient during his hospitalization and upon discharge I will arrange follow-up in my office for elective scheduling for cholecystectomy. Dictated by:   Angelia Mould. Derrell Lolling, M.D. Attending Physician:  Judie Petit DD:  02/04/02 TD:  02/05/02 Job: [id number]

## 2011-04-22 NOTE — Op Note (Signed)
NAME:  Bryan David, Bryan David NO.:  [id number]   MEDICAL RECORD NO.:  [id number]          PATIENT TYPE:  AMB   LOCATION:  [facility]                         FACILITY:  [facility]   PHYSICIAN:  Valetta Fuller, M.D.  DATE OF BIRTH:  Jun 27, 1925   DATE OF PROCEDURE:  05/10/2006  DATE OF DISCHARGE:                                 OPERATIVE REPORT   PREOPERATIVE DIAGNOSIS:  1.  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  2.  Remote history of renal cell carcinoma status post right partial      nephrectomy.   POSTOPERATIVE DIAGNOSIS:  1.  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  2.  Remote history of renal cell carcinoma status post right partial      nephrectomy.  3.  Left lateral wall bladder tumor.   PROCEDURE PERFORMED:  Cystoscopy, bladder barbotage for cytology, bilateral  retrograde pyelography, bladder biopsy right lateral wall, left lateral  wall, trigone, and prostatic urethra, and transurethral resection of left  lateral bladder tumor.   SURGEON:  Valetta Fuller, M.D.   ANESTHESIA:  General.   INDICATIONS:  Mr. Astorga is in his upper 2s.  He has had a complex urologic  history.  He had a renal cell carcinoma approximately 5-6 years ago and  underwent partial nephrectomy of the right kidney.  He ended up having a 4  cm tumor and has never had any evidence of metastatic or recurrent disease.  The patient had a concurrent transitional cell carcinoma of his bladder  diagnosed.  This tumor initially was moderate grade and noninvasive.  The  patient had a recurrence of transitional cell carcinoma approximately a year  after his initial tumor which was mixed grade and noninvasive.  The patient  received mitomycin at that time.  The patient eventually ended up having  positive cytology and had positive biopsies for  carcinoma in situ in the  fall of 2004.  The patient had BCG x6.  Cytology was positive again in  January 2006 but repeat biopsies and cytology were negative at that time.  Approximately six months ago, he had additional biopsies which did show  carcinoma in situ primarily involving the left lateral wall of his bladder  and dome.  The patient has received an additional six week course of BCG  along with Antron A.  He has done well clinically.  We felt that he needed  to come to the OR for random biopsies and reassessment.   OPERATIVE TECHNIQUE AND FINDINGS:  The patient was brought to the operating  room where he had successful induction of general anesthesia and was placed  in the lithotomy position, prepped and draped in the usual manner.  Initial  cystoscopy showed some mild trilobar hyperplasia.  There was a little bit of  abnormal appearing mucosa that it was difficult to determine if it was  inflammatory or possibly some early transitional cell  carcinoma within the  prostatic urethra along the left lateral lobe of his prostate.  The bladder  initially appeared endoscopically unremarkable.  There was some mild diffuse  erythema but no obvious areas of carcinoma in situ nor was there any obvious  initial bladder tumor.  I started his assessment by doing a bladder  barbotage with saline for cytology.  At the completion of that, we turned to  retrograde pyelogram.   A 8-French cone-tip catheter was used for the retrogrades.  Real time  fluoroscopy was used and interpretation was performed by myself.  Both  ureters and collecting systems were delicate in appearance without evidence  of filling defects or obstruction.   We then reassessed the bladder using both the 30 degrees as well as the 70  degrees lens system.  Tucked up in the anterior left lateral wall of his  bladder near the bladder neck, we noticed then a nodular 3 cm tumor coming  off a fairly small stalk.  The overlying  mucosa was a little erythematous  but this was either a poorly differentiated nodular transitional cell  carcinoma or potentially could have even  been a stromal type of tumor of  the bladder.  We started out by doing some random biopsies of his bladder.  We did a biopsy of the trigone and right lateral wall of the bladder.  I  also took some cold cup biopsies of the left lateral wall of the bladder.   We then changed over to the resectoscope.  Continuous flow resectoscope was  used.  It was used to fulgurate the areas of previous biopsy.  I then used  the resectoscope to resect this 3-4 cm nodular mass down to muscle fibers of  the bladder.  All this tissue was then sent separately.  Hemostasis was  excellent at the completion of the procedure.  We ended up placing a 20-  [place] Foley catheter.  The patient was brought to the recovery room in  stable condition.           ______________________________  Valetta Fuller, M.D.  Electronically Signed     DSG/MEDQ  D:  05/10/2006  T:  05/10/2006  Job:  [id number]

## 2011-04-22 NOTE — Op Note (Signed)
NAME:  Bryan David, Bryan David NO.:  [id number]   MEDICAL RECORD NO.:  [id number]          PATIENT TYPE:  AMB   LOCATION:  [facility]                         FACILITY:  [facility]   PHYSICIAN:  Valetta Fuller, M.D.  DATE OF BIRTH:  08/20/1925   DATE OF PROCEDURE:  03/09/2005  DATE OF DISCHARGE:                                 OPERATIVE REPORT   PREOPERATIVE DIAGNOSES:  1.  History of transitional cell carcinoma.  2.  Positive urine cytology.   POSTOPERATIVE DIAGNOSES:  1.  History of transitional cell carcinoma.  2.  Positive urine cytology.   PROCEDURE PERFORMED:  Cystoscopy, bladder barbotage for urinary cytology,  bilateral retrograde pyelography, bladder biopsy x5 with fulguration.   SURGEON:  Dr. Isabel Caprice.   ANESTHESIA:  General.   INDICATIONS:  [name] is a 77 year old male. He has a complex urologic  history. This includes a history of renal cell carcinoma involving the right  kidney for which he underwent partial nephrectomy in 2002 and is without  evidence of recurrence. The patient concurrently at that time had been  diagnosed with a transitional cell carcinoma of the bladder. The patient has  had several recurrences. The patient has had several resections and has also  had intravesical therapy. The patient has over the last 6 to 12 months had a  couple of occasions where he has had positive urine cytology. He was  assessed 6 months ago with bilateral retrogrades and random biopsies which  failed to reveal any malignancy. He was then recently for follow-up at which  point cystoscopy was unremarkable. The patient, however, again was noted to  have positive urine cytology from the January of this year. His Urovision  FISH was also positive. The patient now is undergoing repeat assessment of  his urinary tract.   TECHNIQUE AND FINDINGS:  The patient was brought to the operating room where  he had successful induction of general anesthesia. He was placed in  lithotomy position, prepped and draped in the usual manner. The patient  continues to have significant trilobar hyperplasia. In the past, he had  undergone TUIP which did show good results with a nice open bladder neck,  but he still has considerable lateral lobe tissue. The patient had a lot of  hyperemic blood vessels with some slight oozing from the lateral lobes of  the prostate with advancing of the cystoscope. The bladder itself showed no  obvious tumor or obvious areas of carcinoma in situ. There were multiple  layers of the bladder that showed some slight increased erythema and some  dysplasia could not be ruled out. We started by doing a saline bladder  barbotage for cytology. We then did bilateral retrograde pyelograms which  showed very normal delicate appearing collecting systems and ureters  bilaterally without evidence of obstruction or filling defects. We ended up  then doing primarily random biopsies. I did several cold-cup biopsies of the  prostatic urethra and midline  bladder neck. We also did a biopsy the posterior wall and both lateral  lobes. The patient appeared to tolerate these well. All these  areas were  fulgurated after the biopsy. No obvious problems or complications occurred,  and he was brought to recovery room in stable condition.      DSG/MEDQ  D:  03/09/2005  T:  03/09/2005  Job:  [id number]

## 2011-04-22 NOTE — Consult Note (Signed)
NAME:  Bryan David, Bryan David NO.:  [id number]   MEDICAL RECORD NO.:  [id number]                   PATIENT TYPE:  INP   LOCATION:  0452                                 FACILITY:  [facility]   PHYSICIAN:  Ollen Gross. Vernell Morgans, M.D.              DATE OF BIRTH:  March 01, 1925   DATE OF CONSULTATION:  10/05/2002  DATE OF DISCHARGE:                                   CONSULTATION   HISTORY OF PRESENT ILLNESS:  The patient is a 77 year old gentleman who  initially had problems with shortness of breath and this was in early 2003.  During this workup, he was found to have a pulmonary embolus in his right  lung.  He shortly thereafter developed abdominal pain and was diagnosed with  gallstone pancreatitis.  He was evaluated by Dr. Derrell David who at the time felt  it would be best to get him over the episode of pulmonary embolus and get  him off of the Coumadin before proceeding with surgery for his gallbladder.  He was at home doing well but over the last week he developed worsening  abdominal pain, nausea and vomiting.  He returns today with significant  elevation of his amylase and liver function tests consistent with gallstone  pancreatitis.  He has some shortness of breath secondary to his pulmonary  embolus.   REVIEW OF SYSTEMS:  He denies any chest pain, diarrhea, dysuria,  constipation.  His other review of systems is unremarkable.   PAST MEDICAL HISTORY:  1. Bladder cancer.  2. Renal cell cancer.  3. Pulmonary embolus.  4. Reflux.   PAST SURGICAL HISTORY:  1. Cystoscopy.  2. Partial nephrectomy.   MEDICATIONS:  Include Nexium and Coumadin.   ALLERGIES:  No known drug allergies.   SOCIAL HISTORY:  He has a remote history of smoking.  Denies any alcohol  use.   FAMILY HISTORY:  Significant for coronary artery disease.   PHYSICAL EXAMINATION:  VITAL SIGNS:  He is afebrile with stable vitals.  GENERAL:  He is a thin, elderly white male in no acute distress.  SKIN:   Warm and dry with no jaundice.  HEENT:  Extraocular muscles intact.  Pupils equal, round, reactive to light.  NECK:  No bruits.  LUNGS:  Clear bilaterally.  HEART:  Regular rate and rhythm.  ABDOMEN:  Soft with some mild to moderate central tenderness but no guarding  or peritoneal signs.  EXTREMITIES:  No clubbing, cyanosis, or edema.  NEUROLOGICAL:  Alert and oriented x4.  HEMATOLOGIC:  I can palpate no lymphadenopathy.   LABORATORY DATA:  His alk phos is 256, his total bili is 5.1, SGOT is 297,  SGPT is 263, amylase is 2400, creatinine 0.9.  White count is 14.1,  hemoglobin 15.1, platelet count 202,000.   ASSESSMENT/PLAN:  This is a 77 year old gentleman who appears to have an  acute episode of  gallstone pancreatitis.  I agree with admitting him for IV  hydration and bowel rest.  We will obtain an ultrasound of his right upper  quadrant to further evaluate his gallbladder, and we will plan to follow his  physical exam, and liver function tests showed his liver function not  resolved.  He will need a GI consult to a possible ERCP, and we will discuss  the situation with Dr. Derrell David as to whether he should have a possible  cholecystectomy done this admission.                                               Ollen Gross. Vernell Morgans, M.D.    PST/MEDQ  D:  10/05/2002  T:  10/06/2002  Job:  [id number]

## 2011-04-22 NOTE — H&P (Signed)
[facility]  Patient:    Bryan David, Bryan David                        MRN: [id number] Adm. Date:  [id number] Disc. Date: [id number] Attending:  Mardella Layman CC:         Vania Rea. Jarold Motto, M.D. [facility]   History and Physical  CHIEF COMPLAINT:  Bladder tumor and right-sided renal cell carcinoma.  HISTORY OF PRESENT ILLNESS:  Mr. Pipkins was sent to our office because of some microscopic hematuria.  He is a 77 year old male.  He has also had some obstructive and irritative voiding symptoms.  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  Also, as part of his evaluation, the patient had a renal ultrasound which showed a suspicious solid right-sided renal mass.  This was further evaluated with a CT scan and a solid enhancing lesion of the right kidney was appreciated.  This was felt to be consistent with a renal cell carcinoma.  The patient now presents for flank exploration with probable partial nephrectomy, possible radical nephrectomy.  We will then plan on changing positions and performing a transurethral resection of the bladder and TURBT.  He has no other complaints or concerns.  PAST MEDICAL HISTORY:  Relatively unremarkable.  He does have a significant smoking history but quit 20 years ago.  MEDICATIONS:  He is on some Nexium, as well as Flomax.  He was previously on a daily aspirin.  FAMILY HISTORY:  Unremarkable.  REVIEW OF SYSTEMS:  Completely negative.  PHYSICAL EXAMINATION:  GENERAL:  He is a well-developed, well-nourished male.  VITAL SIGNS:  His blood pressure today was 160/80 with a pulse of 68 and respiratory rate of 20.  NECK:  No JVD.  CHEST:  Clear to auscultation with some slightly decreased breath sounds.  ABDOMEN:  Soft, flat, without obvious masses or  tenderness.  GENITOURINARY:  Some bilateral testicular atrophy, more on the left.  The prostate was 2+ without nodules or induration.  LABORATORY DATA:  BMET is normal with a creatinine of 1.0.  The patients CBC is also normal with a hemoglobin of 15.1.  ASSESSMENT:  Asymptomatic patient with microscopic hematuria.  He has been diagnosed with a solid renal lesion consistent with a renal cell carcinoma and a papillary transitional cell carcinoma of his bladder.  He is to have both these issues addressed today and will be admitted for routine postoperative care. DD:  06/13/01 TD:  06/13/01 Job: [id number]

## 2011-04-22 NOTE — Discharge Summary (Signed)
NAME:  AUDLEY, HINOJOS NO.:  [id number]   MEDICAL RECORD NO.:  [id number]                   PATIENT TYPE:  INP   LOCATION:  0452                                 FACILITY:  [facility]   PHYSICIAN:  Angelia Mould. Derrell Lolling, M.D.             DATE OF BIRTH:  [birth year]   DATE OF ADMISSION:  10/05/2002  DATE OF DISCHARGE:  10/12/2002                                 DISCHARGE SUMMARY   FINAL DIAGNOSES:  1. Gallstone pancreatitis.  2. Chronic cholecystitis with cholelithiasis.  3. Choledocholithiasis.  4. History of pulmonary embolism.  5. History of transurethral resection bladder tumor.  6. History of right partial nephrectomy.  7. Chronic obstructive pulmonary disease.  8. Gastroesophageal reflux disease.   OPERATION PERFORMED:  1. Laparoscopic cholecystectomy with intraoperative cholangiogram, October 10, 2002.  2. Endoscopic retrograde cholangiopancreatography with spincterotomy and     removal of common bile duct stone, October 11, 2002.   HISTORY:  This is a 77 year old white man who was admitted on October 05, 2002 with abdominal pain similar to an episode he had had several months  previously.  Past history was significant for a hospitalization for  gallstones and gallstone pancreatitis approximately 6-7 months ago, but he  was found to have acute pulmonary embolism and his gallstone pancreatitis  resolved, and it was agreed that we would defer his surgery until later.  He  had to be admitted at this time for abdominal pain.   PAST MEDICAL HISTORY:  1. Bladder cancer.  2. Renal cell cancer of the right kidney.  3. Pulmonary embolism in March of 2003.  4. Known gallstones.  5. Known gastroesophageal reflux disease.   MEDICATIONS ON ADMISSION:  1. Nexium.  2. Coumadin.   ALLERGIES:  None known.   PHYSICAL EXAMINATION:  GENERAL:  Pleasant older gentleman in mild to  moderate distress.  VITAL SIGNS:  Temp 98.8, blood pressure 184/95, heart  rate 80, respiratory  rate 18.  ABDOMEN:  Tender and a little bit distended.   ADMISSION DATA:  Lipase 28, amylase 78 on October 27.  Total bilirubin 5.1,  alkaline phosphatase 256.  On the day of admission, his amylase was 2410 and  lipase was 1359.   HOSPITAL COURSE:  The patient was admitted by Georgina Quint. Plotnikov, M.D. and  Coumadin was held and Lovenox was started.  He was started on antibiotics.  Surgery was consulted and Ollen Gross. Carolynne Edouard, M.D. saw the patient and basically  agreed that he had recurrent gallstone pancreatitis.  The patient had been  seen by me and so I became involved in his care.  Over the next 2-3 days,  his pain improved.  His liver function tests improved.  His amylase and  lipase came down.   On October 07, 2002, he was feeling better.  He still had a fever of 101 and  was kept on antibiotics.  We stopped his Lovenox and gave him some vitamin K  in anticipation of cholecystectomy during the same week.  By November 5th,  he was improving a great deal, was tolerating p.o. liquids, had become  afebrile.  His abdomen had become nontender.  Stool studies showed no  abnormalities, no evidence of colitis.  Because of his COPD, he was given  Xopenex nebs.   The patient came to the operating room on October 10, 2002.  We performed a  laparoscopic cholecystectomy with cholangiogram.  The cholangiogram  suggested a couple of small filling defects in the common bile duct.  Surgery was uneventful.  Postoperatively, Iva Boop, M.D. saw the  patient and performed ERCP and spincterotomy and removed a couple of small  stone fragments.  The patient did very well thereafter and was ready to go  home on October 12, 2002.  At that time, he was afebrile, tolerating diet,  had no pain, no abdominal tenderness, wounds were healing uneventfully.   FOLLOW UP:  He was asked to come back and see me in the office in 2-3 weeks.  He was also asked to make an appointment with his  primary care physician,  Joycelyn Rua, M.D. to discuss Coumadin use.  Advice by the internal  medicine service of [place] was that he did not need any more Coumadin  because  he had had that for six months for his pulmonary embolism, but I  wanted Joycelyn Rua, M.D. to make the final decision on that.   FINAL DISCHARGE MEDICATIONS:  1. Vicodin for pain.  2. Nexium for his reflux.                                               Angelia Mould. Derrell Lolling, M.D.    HMI/MEDQ  D:  10/20/2002  T:  10/20/2002  Job:  [id number]   cc:   Joycelyn Rua, M.D.  [address] [place]  [place] [address]  Fax: [phone]   Georgina Quint. Plotnikov, M.D. [facility]   Iva Boop, M.D. [facility]  [address] [place], [place] [address]  Fax: 1

## 2011-04-26 ENCOUNTER — Encounter: Payer: Self-pay | Admitting: Internal Medicine

## 2011-04-27 ENCOUNTER — Ambulatory Visit (INDEPENDENT_AMBULATORY_CARE_PROVIDER_SITE_OTHER): Payer: Medicare Other | Admitting: Internal Medicine

## 2011-04-27 ENCOUNTER — Encounter: Payer: Self-pay | Admitting: Internal Medicine

## 2011-04-27 VITALS — BP 150/80 | HR 55 | Temp 97.6°F | Wt 175.0 lb

## 2011-04-27 DIAGNOSIS — C679 Malignant neoplasm of bladder, unspecified: Secondary | ICD-10-CM

## 2011-04-27 DIAGNOSIS — G589 Mononeuropathy, unspecified: Secondary | ICD-10-CM

## 2011-04-27 DIAGNOSIS — K219 Gastro-esophageal reflux disease without esophagitis: Secondary | ICD-10-CM

## 2011-04-27 DIAGNOSIS — J449 Chronic obstructive pulmonary disease, unspecified: Secondary | ICD-10-CM

## 2011-04-27 DIAGNOSIS — I10 Essential (primary) hypertension: Secondary | ICD-10-CM

## 2011-04-27 DIAGNOSIS — G5600 Carpal tunnel syndrome, unspecified upper limb: Secondary | ICD-10-CM

## 2011-04-27 NOTE — Progress Notes (Signed)
Subjective:    Patient ID: Bryan David, male    DOB: [birth year], 77 y.o.   MRN: [id number]  HPI DOing okay Did have carpal tunnel release in December Better but still with mild symptoms Still has abnormal sensation in radial nerve distribution  Still has trouble making a fist Arthritis in PIPs though Uses aspirin and ibuprofen prn----does help  No headaches No chest pain No sig SOB---does have limitations of walking, due to dyspnea. Even tires if prolonged standing No edema  No heartburn or stomach problems Uses nexium prn only  Sees urologist occ  Current outpatient prescriptions:aspirin 81 MG tablet, Take 81 mg by mouth daily.  , Disp: , Rfl: ;  esomeprazole (NEXIUM) 40 MG capsule, Take 40 mg by mouth daily before breakfast.  , Disp: , Rfl: ;  ibuprofen (ADVIL,MOTRIN) 200 MG tablet, Take 200 mg by mouth every 6 (six) hours as needed.  , Disp: , Rfl: ;  triamterene-hydrochlorothiazide (MAXZIDE-25) 37.5-25 MG per tablet, Take 1 tablet by mouth daily.  , Disp: , Rfl:  DISCONTD: Ibuprofen-Diphenhydramine HCl (ADVIL PM) 200-25 MG CAPS, Take 1-2 capsules by mouth as needed.  , Disp: , Rfl: ;  DISCONTD: aspirin 325 MG tablet, Take 325 mg by mouth daily.  , Disp: , Rfl: ;  DISCONTD: fish oil-omega-3 fatty acids 1000 MG capsule, Take 2 g by mouth daily.  , Disp: , Rfl: ;  DISCONTD: Potassium Gluconate 2.5 MEQ TABS, Take by mouth daily.  , Disp: , Rfl:  DISCONTD: vitamin B-12 (CYANOCOBALAMIN) 500 MCG tablet, Take 500 mcg by mouth 2 (two) times daily.  , Disp: , Rfl:   Past Medical History  Diagnosis Date  . Bladder cancer   . Renal cancer   . Pulmonary embolus   . GERD (gastroesophageal reflux disease)   . Arthritis   . COPD (chronic obstructive pulmonary disease)   . Hypertension   . Urinary frequency     Past Surgical History  Procedure Date  . Inguinal hernia repair 1998    right  . Cholecystectomy   . Kidney surgery   . Bladder surgery   . Esophagogastroduodenoscopy   .  Carpal tunnel release 12/11    Right---Dr Mina Marble    Family History  Problem Relation Age of Onset  . Hypertension Mother   . Cancer Neg Hx     prostate or colon    History   Social History  . Marital Status: Married    Spouse Name: N/A    Number of Children: 2  . Years of Education: N/A   Occupational History  . retired - Land, still does some produce    Social History Main Topics  . Smoking status: Former Smoker -- 1.0 packs/day for 40 years    Types: Cigarettes  . Smokeless tobacco: Never Used  . Alcohol Use: No  . Drug Use: Not on file  . Sexually Active: Not on file   Other Topics Concern  . Not on file   Social History Narrative   Enjoys rabbit huntingNo living willAsks for wife as health care POAwilling to try resuscitation but no prolonged machinesNot sure about feeding tube   Review of Systems Sleeps okay--occ naps Appetite is fine Weight is down a few pounds Works hard in garden--always loses some weight this time of year Mood has been better per wife     Objective:   Physical Exam  Constitutional: He appears well-developed and well-nourished. No distress.  Neck: Normal range of motion. Neck  supple.  Cardiovascular: Normal rate, regular rhythm and normal heart sounds.  Exam reveals no gallop.   No murmur heard. Pulmonary/Chest: Effort normal. No respiratory distress. He has no wheezes. He has rales.       Faint bibasilar crackles  Abdominal: Soft. There is no tenderness.  Musculoskeletal: Normal range of motion. He exhibits no edema and no tenderness.  Lymphadenopathy:    He has no cervical adenopathy.  Psychiatric: He has a normal mood and affect. His behavior is normal. Judgment and thought content normal.          Assessment & Plan:

## 2011-09-20 ENCOUNTER — Other Ambulatory Visit: Payer: Self-pay | Admitting: Gastroenterology

## 2011-09-20 NOTE — Telephone Encounter (Signed)
Advised patient's wife that patient will need office visit for refills. He has not been seen in over 2 years. Offered appointment on this Thursday or Friday, however, wife states that she will have patient call back and make an appointment. Advised that we will give refills at patients office visit.

## 2011-09-23 ENCOUNTER — Encounter: Payer: Self-pay | Admitting: Gastroenterology

## 2011-09-23 ENCOUNTER — Ambulatory Visit (INDEPENDENT_AMBULATORY_CARE_PROVIDER_SITE_OTHER): Payer: Medicare Other | Admitting: Gastroenterology

## 2011-09-23 DIAGNOSIS — I7300 Raynaud's syndrome without gangrene: Secondary | ICD-10-CM

## 2011-09-23 DIAGNOSIS — M159 Polyosteoarthritis, unspecified: Secondary | ICD-10-CM | POA: Insufficient documentation

## 2011-09-23 DIAGNOSIS — K589 Irritable bowel syndrome without diarrhea: Secondary | ICD-10-CM | POA: Insufficient documentation

## 2011-09-23 DIAGNOSIS — J449 Chronic obstructive pulmonary disease, unspecified: Secondary | ICD-10-CM

## 2011-09-23 DIAGNOSIS — K219 Gastro-esophageal reflux disease without esophagitis: Secondary | ICD-10-CM

## 2011-09-23 DIAGNOSIS — M129 Arthropathy, unspecified: Secondary | ICD-10-CM

## 2011-09-23 DIAGNOSIS — M130 Polyarthritis, unspecified: Secondary | ICD-10-CM

## 2011-09-23 MED ORDER — ESOMEPRAZOLE MAGNESIUM 40 MG PO CPDR: 40.0000 mg | DELAYED_RELEASE_CAPSULE | Freq: Every day | ORAL | Status: DC

## 2011-09-23 NOTE — Progress Notes (Signed)
This is a extremely pleasant 78 year old Caucasian male with degenerative joint disease of his back and rather severe scoliosis. However, he is very active and works 12 hours a day in his garden. I have been following him for several years because of chronic GERD, and he is currently asymptomatic on Nexium 40 mg a day. He does have Raynaud's phenomenon with cold and painful hands, and wears gloves constantly. He does use when necessary Motrin 200 mg for back pain, but denies any history of melena, hematochezia, lower gastrointestinal or hepatobiliary complaints. Status post partial right nephrectomy, transurethral resection of a bladder tumor, and has had cholecystectomy and removal of a common bile duct stone endoscopically in 2003. Appetite is good and his weight is stable. He denies any specific food intolerances.   Current Medications, Allergies, Past Medical History, Past Surgical History, Family History and Social History were reviewed in [employer] record.  Pertinent Review of Systems Negative... important review of systems negative without recurrent cardiovascular or urologic complaints. He does have mild COPD and dyspnea with exertion. With smoking some 30 years ago. He denies cough, sputum production, or hemoptysis.   Physical Exam: Thin-appearing male in no acute distress appearing younger than his stated age. I cannot appreciate stigmata of chronic liver disease. There are diminished breath sounds in both lung fields with scattered rhonchi diffusely. He is in a regular rhythm without murmurs gallops or rubs. There is no organomegaly, abdominal masses or tenderness. Bowel sounds are normal. Peripheral extremities are unremarkable as is exam of the back. He does have mild kyphoscoliosis. Patient has very cold digits no ulcerations or evidence of inflammation. Blood pressure today 128/62 and weight 170 pounds.    Assessment and Plan: Chronic GERD associated with Raynaud's  phenomenon and probable associated esophageal motility disorder. Patient also has COPD and degenerative arthritis of his back. He seems to be doing extremely well on his current medications with periodic primary care followup. I did not check any labs today but renewed his medications, and reviewed a reflux regime with the patient. He is up-to-date on his endoscopy and colonoscopy exams. Encounter Diagnoses  Name Primary?  . IBS (irritable bowel syndrome)   . GERD (gastroesophageal reflux disease)   . Raynaud phenomenon

## 2011-09-23 NOTE — Patient Instructions (Signed)
We have sent the following medications to your pharmacy for you to pick up at your convenience: Nexium 40 mg daily. Your physician has requested that you go to the basement for the following lab work before leaving today: IFOB

## 2011-10-12 ENCOUNTER — Other Ambulatory Visit: Payer: Medicare Other

## 2011-10-12 ENCOUNTER — Other Ambulatory Visit: Payer: Self-pay | Admitting: Gastroenterology

## 2011-10-12 DIAGNOSIS — Z1211 Encounter for screening for malignant neoplasm of colon: Secondary | ICD-10-CM

## 2011-10-12 LAB — FECAL OCCULT BLOOD, IMMUNOCHEMICAL: Fecal Occult Bld: NEGATIVE

## 2011-11-02 ENCOUNTER — Encounter: Payer: Self-pay | Admitting: Internal Medicine

## 2011-11-02 ENCOUNTER — Ambulatory Visit (INDEPENDENT_AMBULATORY_CARE_PROVIDER_SITE_OTHER): Payer: Medicare Other | Admitting: Internal Medicine

## 2011-11-02 DIAGNOSIS — M1990 Unspecified osteoarthritis, unspecified site: Secondary | ICD-10-CM

## 2011-11-02 DIAGNOSIS — H9319 Tinnitus, unspecified ear: Secondary | ICD-10-CM

## 2011-11-02 DIAGNOSIS — K219 Gastro-esophageal reflux disease without esophagitis: Secondary | ICD-10-CM

## 2011-11-02 DIAGNOSIS — E538 Deficiency of other specified B group vitamins: Secondary | ICD-10-CM

## 2011-11-02 DIAGNOSIS — I10 Essential (primary) hypertension: Secondary | ICD-10-CM

## 2011-11-02 DIAGNOSIS — J449 Chronic obstructive pulmonary disease, unspecified: Secondary | ICD-10-CM

## 2011-11-02 LAB — CBC WITH DIFFERENTIAL/PLATELET
Eosinophils Relative: 3.3 % (ref 0.0–5.0)
Lymphocytes Relative: 19.5 % (ref 12.0–46.0)
MCV: 86.8 fl (ref 78.0–100.0)
Monocytes Absolute: 0.6 10*3/uL (ref 0.1–1.0)
Neutrophils Relative %: 68.3 % (ref 43.0–77.0)
Platelets: 146 10*3/uL — ABNORMAL LOW (ref 150.0–400.0)
RBC: 4.99 Mil/uL (ref 4.22–5.81)
WBC: 7.4 10*3/uL (ref 4.5–10.5)

## 2011-11-02 LAB — BASIC METABOLIC PANEL
BUN: 16 mg/dL (ref 6–23)
Calcium: 9.3 mg/dL (ref 8.4–10.5)
Chloride: 101 mEq/L (ref 96–112)
Creatinine, Ser: 1.3 mg/dL (ref 0.4–1.5)
GFR: 57.43 mL/min — ABNORMAL LOW (ref >60.00)

## 2011-11-02 LAB — VITAMIN B12: Vitamin B-12: 325 pg/mL (ref 211–911)

## 2011-11-02 LAB — TSH: TSH: 1.07 u[IU]/mL (ref 0.35–5.50)

## 2011-11-02 LAB — HEPATIC FUNCTION PANEL
AST: 17 U/L (ref 0–37)
Albumin: 4.1 g/dL (ref 3.5–5.2)
Alkaline Phosphatase: 54 U/L (ref 39–117)
Bilirubin, Direct: 0.1 mg/dL (ref 0.0–0.3)

## 2011-11-02 MED ORDER — TRIAMTERENE-HCTZ 37.5-25 MG PO TABS: 1.0000 | ORAL_TABLET | Freq: Every day | ORAL | Status: DC

## 2011-11-02 NOTE — Progress Notes (Signed)
Subjective:    Patient ID: Bryan David, male    DOB: Dec 29, 1924, 77 y.o.   MRN: [id number]  HPI Doing well Here with wife  Recent eval with Dr Jarold Motto No changes Uses the nexium for GERD--still not daily though No swallowing problems Intermittent diarrhea--loose and dark black. Last episode ~1 month ago though No abdominal pain  Right ear bothering him Has intermittent tinnitus May have improved after wife put in ear drops for cerumen  Still with back and shoulder pain at times Does use ibuprofen but only ~twice a week  No chest pain Chronic DOE--no sig change Only occ cough--did have recent cold with some phlegm but this resolved No wheezing  Current Outpatient Prescriptions on File Prior to Visit  Medication Sig Dispense Refill  . aspirin 81 MG tablet Take 81 mg by mouth daily.        Marland Kitchen esomeprazole (NEXIUM) 40 MG capsule Take 1 capsule (40 mg total) by mouth daily before breakfast.  90 capsule  2  . ibuprofen (ADVIL,MOTRIN) 200 MG tablet Take 200 mg by mouth as needed.         Allergies  Allergen Reactions  . Acetaminophen     Past Medical History  Diagnosis Date  . Bladder cancer   . Renal cancer   . Pulmonary embolus   . GERD (gastroesophageal reflux disease)   . Arthritis   . COPD (chronic obstructive pulmonary disease)   . Hypertension   . Urinary frequency   . Carpal tunnel syndrome   . Vitamin B12 deficiency   . Hiatal hernia   . Esophageal stricture   . Osteoarthritis   . Diverticulosis   . PE (pulmonary embolism)   . Choledocholithiasis   . Biliary acute pancreatitis     Past Surgical History  Procedure Date  . Inguinal hernia repair 1998    right  . Cholecystectomy   . Partial nephrectomy   . Esophagogastroduodenoscopy   . Carpal tunnel release 12/11    Right---Dr Mina Marble  . Transurethral resection of prostate     limited  . Cystostomy w/ bladder biopsy     multiple    Family History  Problem Relation Age of Onset  .  Hypertension Mother   . Colon cancer Neg Hx   . Heart failure Mother   . Stroke Mother   . Stroke Brother   . Heart attack Brother   . Diabetes Sister   . Stroke Sister     History   Social History  . Marital Status: Married    Spouse Name: N/A    Number of Children: 2  . Years of Education: N/A   Occupational History  . retired - Land, still does some produce    Social History Main Topics  . Smoking status: Former Smoker -- 1.0 packs/day for 40 years    Types: Cigarettes  . Smokeless tobacco: Never Used  . Alcohol Use: No  . Drug Use: No  . Sexually Active: Not on file   Other Topics Concern  . Not on file   Social History Narrative   Enjoys rabbit huntingNo living willAsks for wife as health care POAwilling to try resuscitation but no prolonged machinesNot sure about feeding tube   Review of Systems Stopped B12 supplements Appetite is good Weight is stable Sleeps okay Wrist is finally some better about 1 year since carpal tunnel repair    Objective:   Physical Exam  Constitutional: He appears well-developed and well-nourished. No distress.  Neck: Normal range of motion. Neck supple. No thyromegaly present.  Cardiovascular: Normal rate, regular rhythm and normal heart sounds.  Exam reveals no gallop.   No murmur heard.      Very faint pedal pulses  Pulmonary/Chest: Effort normal. No respiratory distress. He has no wheezes. He has no rales.       Mild decreased breath sounds Slight rhonchi  Abdominal: Soft. There is no tenderness.  Musculoskeletal: He exhibits no edema and no tenderness.  Lymphadenopathy:    He has no cervical adenopathy.  Skin:       Superficial varicosities Mycotic right great toenail  Psychiatric: He has a normal mood and affect. His behavior is normal. Judgment and thought content normal.          Assessment & Plan:

## 2011-11-02 NOTE — Assessment & Plan Note (Signed)
Back mostly and shoulder Gets relief from occ ibuprofen

## 2011-11-02 NOTE — Assessment & Plan Note (Signed)
Intermittent Right canal clear now

## 2011-11-02 NOTE — Assessment & Plan Note (Signed)
Has been off supplements Will check level and restart if low

## 2011-11-02 NOTE — Assessment & Plan Note (Signed)
Doing well  No dysphagia Only uses nexium prn--advised him to use it if he takes the ibuprofen

## 2011-11-02 NOTE — Assessment & Plan Note (Signed)
BP Readings from Last 3 Encounters:  11/02/11 126/54  09/23/11 128/62  04/27/11 150/80   Good control No changes Due for labs

## 2011-11-02 NOTE — Assessment & Plan Note (Signed)
Mild stable symptoms No Rx needed

## 2011-11-08 ENCOUNTER — Encounter: Payer: Self-pay | Admitting: *Deleted

## 2011-12-12 ENCOUNTER — Ambulatory Visit (INDEPENDENT_AMBULATORY_CARE_PROVIDER_SITE_OTHER): Payer: Medicare Other | Admitting: Ophthalmology

## 2011-12-12 DIAGNOSIS — H2510 Age-related nuclear cataract, unspecified eye: Secondary | ICD-10-CM

## 2011-12-12 DIAGNOSIS — I10 Essential (primary) hypertension: Secondary | ICD-10-CM

## 2011-12-12 DIAGNOSIS — H26499 Other secondary cataract, unspecified eye: Secondary | ICD-10-CM

## 2011-12-12 DIAGNOSIS — H35039 Hypertensive retinopathy, unspecified eye: Secondary | ICD-10-CM

## 2011-12-12 DIAGNOSIS — H35379 Puckering of macula, unspecified eye: Secondary | ICD-10-CM

## 2011-12-12 DIAGNOSIS — H3530 Unspecified macular degeneration: Secondary | ICD-10-CM

## 2011-12-12 DIAGNOSIS — H43819 Vitreous degeneration, unspecified eye: Secondary | ICD-10-CM

## 2011-12-30 ENCOUNTER — Ambulatory Visit (INDEPENDENT_AMBULATORY_CARE_PROVIDER_SITE_OTHER): Payer: Medicare Other | Admitting: Ophthalmology

## 2011-12-30 DIAGNOSIS — H33309 Unspecified retinal break, unspecified eye: Secondary | ICD-10-CM

## 2012-05-02 ENCOUNTER — Encounter: Payer: Self-pay | Admitting: Internal Medicine

## 2012-05-02 ENCOUNTER — Ambulatory Visit (INDEPENDENT_AMBULATORY_CARE_PROVIDER_SITE_OTHER): Payer: Medicare Other | Admitting: Internal Medicine

## 2012-05-02 VITALS — BP 140/80 | HR 63 | Temp 97.7°F | Ht 73.0 in | Wt 174.0 lb

## 2012-05-02 DIAGNOSIS — J449 Chronic obstructive pulmonary disease, unspecified: Secondary | ICD-10-CM

## 2012-05-02 DIAGNOSIS — M129 Arthropathy, unspecified: Secondary | ICD-10-CM

## 2012-05-02 DIAGNOSIS — E538 Deficiency of other specified B group vitamins: Secondary | ICD-10-CM

## 2012-05-02 DIAGNOSIS — M130 Polyarthritis, unspecified: Secondary | ICD-10-CM

## 2012-05-02 DIAGNOSIS — I10 Essential (primary) hypertension: Secondary | ICD-10-CM

## 2012-05-02 DIAGNOSIS — K222 Esophageal obstruction: Secondary | ICD-10-CM

## 2012-05-02 NOTE — Assessment & Plan Note (Signed)
Does okay with the ibuprofen Also relief from biofreeze

## 2012-05-02 NOTE — Assessment & Plan Note (Signed)
Mild limitation with walking but stable Stays active--esp this time of year with garden

## 2012-05-02 NOTE — Assessment & Plan Note (Signed)
Discussed restarting his oral supplements

## 2012-05-02 NOTE — Assessment & Plan Note (Signed)
BP Readings from Last 3 Encounters:  05/02/12 140/80  11/02/11 126/54  09/23/11 128/62   Good control No changes Lab Results  Component Value Date   CREATININE 1.3 11/02/2011

## 2012-05-02 NOTE — Assessment & Plan Note (Signed)
No swallowing problems Uses nexium sporadically---discussed being somewhat regular

## 2012-05-02 NOTE — Progress Notes (Signed)
Subjective:    Patient ID: Bryan David, male    DOB: July 11, 1925, 78 y.o.   MRN: [id number]  HPI Here with wife Feels well Stays active with garden--- 2 acres  No sig cough No SOB but DOE when walks for a while--this is stable He related this to back pain though Cold weather does "sap me" more than heat  No chest pain No dizziness or syncope  Right shoulder has been bothering him Uses iburprofen prn---usually once a day and occ twice  Uses nexium prn Finds he needs it more if he is going to be bending over in the fields Asked him to take at least qod for gastroprotection  Current Outpatient Prescriptions on File Prior to Visit  Medication Sig Dispense Refill  . aspirin 81 MG tablet Take 81 mg by mouth daily.        Marland Kitchen esomeprazole (NEXIUM) 40 MG capsule Take 1 capsule (40 mg total) by mouth daily before breakfast.  90 capsule  2  . ibuprofen (ADVIL,MOTRIN) 200 MG tablet Take 200 mg by mouth as needed.       . triamterene-hydrochlorothiazide (MAXZIDE-25) 37.5-25 MG per tablet Take 1 each (1 tablet total) by mouth daily.  90 tablet  3    Allergies  Allergen Reactions  . Acetaminophen     Past Medical History  Diagnosis Date  . Bladder cancer   . Renal cancer   . Pulmonary embolus   . GERD (gastroesophageal reflux disease)   . Arthritis   . COPD (chronic obstructive pulmonary disease)   . Hypertension   . Urinary frequency   . Carpal tunnel syndrome   . Vitamin B12 deficiency   . Hiatal hernia   . Esophageal stricture   . Osteoarthritis   . Diverticulosis   . PE (pulmonary embolism)   . Choledocholithiasis   . Biliary acute pancreatitis     Past Surgical History  Procedure Date  . Inguinal hernia repair 1998    right  . Cholecystectomy   . Partial nephrectomy   . Esophagogastroduodenoscopy   . Carpal tunnel release 12/11    Right---Dr Mina Marble  . Transurethral resection of prostate     limited  . Cystostomy w/ bladder biopsy     multiple    Family  History  Problem Relation Age of Onset  . Hypertension Mother   . Colon cancer Neg Hx   . Heart failure Mother   . Stroke Mother   . Stroke Brother   . Heart attack Brother   . Diabetes Sister   . Stroke Sister     History   Social History  . Marital Status: Married    Spouse Name: N/A    Number of Children: 2  . Years of Education: N/A   Occupational History  . retired - Land, still does some produce    Social History Main Topics  . Smoking status: Former Smoker -- 1.0 packs/day for 40 years    Types: Cigarettes  . Smokeless tobacco: Never Used  . Alcohol Use: No  . Drug Use: No  . Sexually Active: Not on file   Other Topics Concern  . Not on file   Social History Narrative   Enjoys rabbit huntingNo living willAsks for wife as health care POAwilling to try resuscitation but no prolonged machinesNot sure about feeding tube   Review of Systems Sleeps well Appetite is great Weight stable Bowels regular----loose stool frequently--like spell every few weeks Voids okay though slow  Objective:   Physical Exam  Constitutional: He appears well-developed and well-nourished. No distress.  Neck: Normal range of motion. Neck supple. No thyromegaly present.  Cardiovascular: Normal rate and regular rhythm.  Exam reveals no gallop.        Faint systolic murmur along left sternal border  Pulmonary/Chest: Effort normal. No respiratory distress. He has no wheezes. He has no rales.       Slight rhonchi  Abdominal: Soft. There is no tenderness.  Musculoskeletal: He exhibits no edema.       Crepitus in right shoulder Limited external rotation especially but no localized tenderness  Lymphadenopathy:    He has no cervical adenopathy.  Psychiatric: He has a normal mood and affect. His behavior is normal. Thought content normal.          Assessment & Plan:

## 2012-05-07 ENCOUNTER — Ambulatory Visit (INDEPENDENT_AMBULATORY_CARE_PROVIDER_SITE_OTHER): Payer: Medicare Other | Admitting: Internal Medicine

## 2012-05-07 ENCOUNTER — Encounter: Payer: Self-pay | Admitting: Internal Medicine

## 2012-05-07 ENCOUNTER — Ambulatory Visit (INDEPENDENT_AMBULATORY_CARE_PROVIDER_SITE_OTHER): Payer: Medicare Other | Admitting: Ophthalmology

## 2012-05-07 VITALS — BP 140/70 | HR 69 | Temp 98.1°F | Wt 173.0 lb

## 2012-05-07 DIAGNOSIS — M75100 Unspecified rotator cuff tear or rupture of unspecified shoulder, not specified as traumatic: Secondary | ICD-10-CM

## 2012-05-07 DIAGNOSIS — M129 Arthropathy, unspecified: Secondary | ICD-10-CM

## 2012-05-07 DIAGNOSIS — M130 Polyarthritis, unspecified: Secondary | ICD-10-CM

## 2012-05-07 DIAGNOSIS — M7551 Bursitis of right shoulder: Secondary | ICD-10-CM

## 2012-05-07 NOTE — Assessment & Plan Note (Signed)
Clearly has some arthritis in shoulder but it doesn't seem to be the biggest problem Will continue current supportive care

## 2012-05-07 NOTE — Assessment & Plan Note (Signed)
Procedure  Sterile prep Anterior approach 3cc 2%lidocaine local and to bursa 40mg  depomedrol and 4cc 2% lidocaine to bursa Immediate pain relief Discussed home care

## 2012-05-07 NOTE — Progress Notes (Signed)
  Subjective:    Patient ID: Bryan David, male    DOB: 1925/04/13, 78 y.o.   MRN: [id number]  HPI Right shoulder has been bad for 3-4 days Really got worse No swelling biofreeze is not really helping now  Current Outpatient Prescriptions on File Prior to Visit  Medication Sig Dispense Refill  . aspirin 81 MG tablet Take 81 mg by mouth daily.        Marland Kitchen esomeprazole (NEXIUM) 40 MG capsule Take 1 capsule (40 mg total) by mouth daily before breakfast.  90 capsule  2  . ibuprofen (ADVIL,MOTRIN) 200 MG tablet Take 200 mg by mouth as needed.       . triamterene-hydrochlorothiazide (MAXZIDE-25) 37.5-25 MG per tablet Take 1 each (1 tablet total) by mouth daily.  90 tablet  3    Allergies  Allergen Reactions  . Acetaminophen     Past Medical History  Diagnosis Date  . Bladder cancer   . Renal cancer   . Pulmonary embolus   . GERD (gastroesophageal reflux disease)   . Arthritis   . COPD (chronic obstructive pulmonary disease)   . Hypertension   . Urinary frequency   . Carpal tunnel syndrome   . Vitamin B12 deficiency   . Hiatal hernia   . Esophageal stricture   . Osteoarthritis   . Diverticulosis   . PE (pulmonary embolism)   . Choledocholithiasis   . Biliary acute pancreatitis     Past Surgical History  Procedure Date  . Inguinal hernia repair 1998    right  . Cholecystectomy   . Partial nephrectomy   . Esophagogastroduodenoscopy   . Carpal tunnel release 12/11    Right---Dr Mina Marble  . Transurethral resection of prostate     limited  . Cystostomy w/ bladder biopsy     multiple    Family History  Problem Relation Age of Onset  . Hypertension Mother   . Colon cancer Neg Hx   . Heart failure Mother   . Stroke Mother   . Stroke Brother   . Heart attack Brother   . Diabetes Sister   . Stroke Sister     History   Social History  . Marital Status: Married    Spouse Name: N/A    Number of Children: 2  . Years of Education: N/A   Occupational History  .  retired - Land, still does some produce    Social History Main Topics  . Smoking status: Former Smoker -- 1.0 packs/day for 40 years    Types: Cigarettes  . Smokeless tobacco: Never Used  . Alcohol Use: No  . Drug Use: No  . Sexually Active: Not on file   Other Topics Concern  . Not on file   Social History Narrative   Enjoys rabbit huntingNo living willAsks for wife as health care POAwilling to try resuscitation but no prolonged machinesNot sure about feeding tube   Review of Systems No fever Eating okay     Objective:   Physical Exam  Constitutional: He appears well-developed and well-nourished.  Musculoskeletal:       Right shoulder ROM limited at about 90 degrees of abduction Limited internal rotation > ext rotation Point tenderness over subacromial bursa          Assessment & Plan:

## 2012-06-25 ENCOUNTER — Emergency Department (HOSPITAL_COMMUNITY): Payer: Medicare Other

## 2012-06-25 ENCOUNTER — Encounter: Payer: Self-pay | Admitting: Family Medicine

## 2012-06-25 ENCOUNTER — Encounter (HOSPITAL_COMMUNITY): Payer: Self-pay | Admitting: Adult Health

## 2012-06-25 ENCOUNTER — Inpatient Hospital Stay (HOSPITAL_COMMUNITY)
Admission: EM | Admit: 2012-06-25 | Discharge: 2012-06-28 | DRG: 193 | Disposition: A | Discharge status: Home / Self Care | Payer: Medicare Other | Attending: Family Medicine | Admitting: Family Medicine

## 2012-06-25 ENCOUNTER — Ambulatory Visit (INDEPENDENT_AMBULATORY_CARE_PROVIDER_SITE_OTHER): Payer: Medicare Other | Admitting: Family Medicine

## 2012-06-25 VITALS — BP 152/70 | HR 82 | Temp 99.6°F | Wt 167.8 lb

## 2012-06-25 DIAGNOSIS — E876 Hypokalemia: Secondary | ICD-10-CM | POA: Diagnosis present

## 2012-06-25 DIAGNOSIS — R0902 Hypoxemia: Secondary | ICD-10-CM

## 2012-06-25 DIAGNOSIS — J4489 Other specified chronic obstructive pulmonary disease: Secondary | ICD-10-CM | POA: Diagnosis present

## 2012-06-25 DIAGNOSIS — I491 Atrial premature depolarization: Secondary | ICD-10-CM

## 2012-06-25 DIAGNOSIS — Z66 Do not resuscitate: Secondary | ICD-10-CM | POA: Diagnosis present

## 2012-06-25 DIAGNOSIS — Z8551 Personal history of malignant neoplasm of bladder: Secondary | ICD-10-CM

## 2012-06-25 DIAGNOSIS — I498 Other specified cardiac arrhythmias: Secondary | ICD-10-CM | POA: Diagnosis present

## 2012-06-25 DIAGNOSIS — I10 Essential (primary) hypertension: Secondary | ICD-10-CM | POA: Diagnosis present

## 2012-06-25 DIAGNOSIS — Z7982 Long term (current) use of aspirin: Secondary | ICD-10-CM

## 2012-06-25 DIAGNOSIS — I4719 Other supraventricular tachycardia: Secondary | ICD-10-CM

## 2012-06-25 DIAGNOSIS — E538 Deficiency of other specified B group vitamins: Secondary | ICD-10-CM | POA: Diagnosis present

## 2012-06-25 DIAGNOSIS — J189 Pneumonia, unspecified organism: Principal | ICD-10-CM | POA: Diagnosis present

## 2012-06-25 DIAGNOSIS — E860 Dehydration: Secondary | ICD-10-CM | POA: Diagnosis present

## 2012-06-25 DIAGNOSIS — I4891 Unspecified atrial fibrillation: Secondary | ICD-10-CM

## 2012-06-25 DIAGNOSIS — Z86711 Personal history of pulmonary embolism: Secondary | ICD-10-CM

## 2012-06-25 DIAGNOSIS — J9600 Acute respiratory failure, unspecified whether with hypoxia or hypercapnia: Secondary | ICD-10-CM | POA: Diagnosis present

## 2012-06-25 DIAGNOSIS — R9431 Abnormal electrocardiogram [ECG] [EKG]: Secondary | ICD-10-CM

## 2012-06-25 DIAGNOSIS — J9601 Acute respiratory failure with hypoxia: Secondary | ICD-10-CM | POA: Diagnosis present

## 2012-06-25 DIAGNOSIS — D72829 Elevated white blood cell count, unspecified: Secondary | ICD-10-CM | POA: Diagnosis present

## 2012-06-25 DIAGNOSIS — J449 Chronic obstructive pulmonary disease, unspecified: Secondary | ICD-10-CM | POA: Diagnosis present

## 2012-06-25 DIAGNOSIS — Z87891 Personal history of nicotine dependence: Secondary | ICD-10-CM

## 2012-06-25 DIAGNOSIS — I471 Supraventricular tachycardia: Secondary | ICD-10-CM

## 2012-06-25 DIAGNOSIS — K219 Gastro-esophageal reflux disease without esophagitis: Secondary | ICD-10-CM | POA: Diagnosis present

## 2012-06-25 DIAGNOSIS — K222 Esophageal obstruction: Secondary | ICD-10-CM | POA: Diagnosis present

## 2012-06-25 HISTORY — DX: Pneumonia, unspecified organism: J18.9

## 2012-06-25 HISTORY — DX: Anxiety disorder, unspecified: F41.9

## 2012-06-25 HISTORY — DX: Shortness of breath: R06.02

## 2012-06-25 LAB — CBC WITH DIFFERENTIAL/PLATELET
Basophils Absolute: 0 10*3/uL (ref 0.0–0.1)
Basophils Relative: 0 % (ref 0–1)
Eosinophils Absolute: 0 10*3/uL (ref 0.0–0.7)
Eosinophils Relative: 0 % (ref 0–5)
HCT: 40.8 % (ref 39.0–52.0)
Hemoglobin: 13.7 g/dL (ref 13.0–17.0)
Lymphocytes Relative: 4 % — ABNORMAL LOW (ref 12–46)
Lymphs Abs: 0.9 10*3/uL (ref 0.7–4.0)
MCH: 28.4 pg (ref 26.0–34.0)
MCHC: 33.6 g/dL (ref 30.0–36.0)
MCV: 84.6 fL (ref 78.0–100.0)
Monocytes Absolute: 1.9 10*3/uL — ABNORMAL HIGH (ref 0.1–1.0)
Monocytes Relative: 10 % (ref 3–12)
Neutro Abs: 17.1 10*3/uL — ABNORMAL HIGH (ref 1.7–7.7)
Neutrophils Relative %: 86 % — ABNORMAL HIGH (ref 43–77)
Platelets: 186 10*3/uL (ref 150–400)
RBC: 4.82 MIL/uL (ref 4.22–5.81)
RDW: 13.6 % (ref 11.5–15.5)
WBC: 19.9 10*3/uL — ABNORMAL HIGH (ref 4.0–10.5)

## 2012-06-25 LAB — BASIC METABOLIC PANEL
BUN: 22 mg/dL (ref 6–23)
CO2: 30 mEq/L (ref 19–32)
Calcium: 9.2 mg/dL (ref 8.4–10.5)
Chloride: 94 mEq/L — ABNORMAL LOW (ref 96–112)
Creatinine, Ser: 1.02 mg/dL (ref 0.50–1.35)
GFR calc Af Amer: 76 mL/min — ABNORMAL LOW (ref >90)
GFR calc non Af Amer: 65 mL/min — ABNORMAL LOW (ref >90)
Glucose, Bld: 126 mg/dL — ABNORMAL HIGH (ref 70–99)
Potassium: 3.3 mEq/L — ABNORMAL LOW (ref 3.5–5.1)
Sodium: 135 mEq/L (ref 135–145)

## 2012-06-25 LAB — CBC
HCT: 40.7 % (ref 39.0–52.0)
MCHC: 33.2 g/dL (ref 30.0–36.0)
WBC: 18.5 10*3/uL — ABNORMAL HIGH (ref 4.0–10.5)

## 2012-06-25 LAB — CREATININE, SERUM: Creatinine, Ser: 0.97 mg/dL (ref 0.50–1.35)

## 2012-06-25 LAB — CARDIAC PANEL(CRET KIN+CKTOT+MB+TROPI)
Relative Index: 2.1 (ref 0.0–2.5)
Total CK: 270 U/L — ABNORMAL HIGH (ref 7–232)

## 2012-06-25 LAB — TROPONIN I: Troponin I: <0.3 ng/mL (ref <0.30)

## 2012-06-25 MED ORDER — ASPIRIN EC 81 MG PO TBEC
162.0000 mg | DELAYED_RELEASE_TABLET | Freq: Every day | ORAL | Status: DC
  Administered 2012-06-25 – 2012-06-28 (×4): 162 mg via ORAL
  Filled 2012-06-25 (×4): qty 2

## 2012-06-25 MED ORDER — IBUPROFEN 400 MG PO TABS
400.0000 mg | ORAL_TABLET | Freq: Once | ORAL | Status: AC
  Administered 2012-06-25: 400 mg via ORAL
  Filled 2012-06-25: qty 1

## 2012-06-25 MED ORDER — ALBUTEROL SULFATE (5 MG/ML) 0.5% IN NEBU: 5.0000 mg | INHALATION_SOLUTION | RESPIRATORY_TRACT | Status: DC | PRN

## 2012-06-25 MED ORDER — IPRATROPIUM BROMIDE 0.02 % IN SOLN
0.5000 mg | Freq: Four times a day (QID) | RESPIRATORY_TRACT | Status: DC
  Administered 2012-06-25 – 2012-06-28 (×10): 0.5 mg via RESPIRATORY_TRACT
  Filled 2012-06-25 (×10): qty 2.5

## 2012-06-25 MED ORDER — DEXTROSE 5 % IV SOLN
1.0000 g | Freq: Once | INTRAVENOUS | Status: AC
  Administered 2012-06-25: 1 g via INTRAVENOUS
  Filled 2012-06-25: qty 10

## 2012-06-25 MED ORDER — ALBUTEROL SULFATE (5 MG/ML) 0.5% IN NEBU
2.5000 mg | INHALATION_SOLUTION | Freq: Four times a day (QID) | RESPIRATORY_TRACT | Status: DC
  Administered 2012-06-25 – 2012-06-28 (×10): 2.5 mg via RESPIRATORY_TRACT
  Filled 2012-06-25 (×9): qty 0.5

## 2012-06-25 MED ORDER — LABETALOL HCL 5 MG/ML IV SOLN: 10.0000 mg | INTRAVENOUS | Status: DC | PRN

## 2012-06-25 MED ORDER — DEXTROSE 5 % IV SOLN
500.0000 mg | Freq: Once | INTRAVENOUS | Status: AC
  Administered 2012-06-25: 500 mg via INTRAVENOUS
  Filled 2012-06-25: qty 500

## 2012-06-25 MED ORDER — ACETAMINOPHEN 650 MG RE SUPP: 650.0000 mg | Freq: Four times a day (QID) | RECTAL | Status: DC | PRN

## 2012-06-25 MED ORDER — DEXTROSE 5 % IV SOLN
1.0000 g | INTRAVENOUS | Status: DC
  Administered 2012-06-26 – 2012-06-27 (×2): 1 g via INTRAVENOUS
  Filled 2012-06-25 (×3): qty 10

## 2012-06-25 MED ORDER — ENOXAPARIN SODIUM 40 MG/0.4ML ~~LOC~~ SOLN
40.0000 mg | SUBCUTANEOUS | Status: DC
  Administered 2012-06-25 – 2012-06-27 (×3): 40 mg via SUBCUTANEOUS
  Filled 2012-06-25 (×4): qty 0.4

## 2012-06-25 MED ORDER — DEXTROSE 5 % IV SOLN
500.0000 mg | INTRAVENOUS | Status: DC
  Administered 2012-06-26 – 2012-06-27 (×2): 500 mg via INTRAVENOUS
  Filled 2012-06-25 (×3): qty 500

## 2012-06-25 MED ORDER — SODIUM CHLORIDE 0.9 % IV BOLUS (SEPSIS)
500.0000 mL | Freq: Once | INTRAVENOUS | Status: AC
  Administered 2012-06-25: 500 mL via INTRAVENOUS

## 2012-06-25 MED ORDER — ALBUTEROL SULFATE (5 MG/ML) 0.5% IN NEBU
2.5000 mg | INHALATION_SOLUTION | RESPIRATORY_TRACT | Status: DC | PRN
  Administered 2012-06-28: 2.5 mg via RESPIRATORY_TRACT
  Filled 2012-06-25 (×2): qty 0.5

## 2012-06-25 MED ORDER — ONDANSETRON HCL 4 MG/2ML IJ SOLN: 4.0000 mg | Freq: Three times a day (TID) | INTRAMUSCULAR | Status: DC | PRN

## 2012-06-25 MED ORDER — ONDANSETRON HCL 4 MG/2ML IJ SOLN: 4.0000 mg | Freq: Four times a day (QID) | INTRAMUSCULAR | Status: DC | PRN

## 2012-06-25 MED ORDER — SODIUM CHLORIDE 0.9 % IJ SOLN
3.0000 mL | Freq: Two times a day (BID) | INTRAMUSCULAR | Status: DC
Start: 2012-06-25 — End: 2012-06-27
  Administered 2012-06-26 – 2012-06-27 (×2): 3 mL via INTRAVENOUS

## 2012-06-25 MED ORDER — PANTOPRAZOLE SODIUM 40 MG PO TBEC
40.0000 mg | DELAYED_RELEASE_TABLET | Freq: Every day | ORAL | Status: DC
  Administered 2012-06-25 – 2012-06-28 (×4): 40 mg via ORAL
  Filled 2012-06-25 (×4): qty 1

## 2012-06-25 MED ORDER — SODIUM CHLORIDE 0.9 % IV SOLN
INTRAVENOUS | Status: AC
  Administered 2012-06-25: 23:00:00 via INTRAVENOUS

## 2012-06-25 MED ORDER — ACETAMINOPHEN 325 MG PO TABS: 650.0000 mg | ORAL_TABLET | Freq: Four times a day (QID) | ORAL | Status: DC | PRN

## 2012-06-25 MED ORDER — ONDANSETRON HCL 4 MG PO TABS: 4.0000 mg | ORAL_TABLET | Freq: Four times a day (QID) | ORAL | Status: DC | PRN

## 2012-06-25 NOTE — ED Notes (Addendum)
Reports SOB, cough and yellow sputum for one week or more, went to [facility] today and sent here due to sats 88% on RA. Hx of COPD, emphysema. Crackles auscultated at bases and low grade fever. Pt able to speak in full sentences and is alert and oriented.  Irregular hr on monitor with no history of fib/flutter/irregular hr. Range from 100-155

## 2012-06-25 NOTE — ED Notes (Signed)
Patient transported to X-ray 

## 2012-06-25 NOTE — ED Notes (Signed)
Dr. Toniann Fail (hospitalist) at bedside explaining admission and diagnosis.

## 2012-06-25 NOTE — ED Provider Notes (Signed)
History    86yM with dyspnea. Gradually worsening over past week. Constant. Coughing more. Productive. Subjective fever. No unusual leg pain or swelling. Hx of COPD. Not on home o2. Denies CP or back pain. No sick contacts.  CSN: [id number]  Arrival date & time 06/25/12  1641   First MD Initiated Contact with Patient 06/25/12 1647      Chief Complaint  Patient presents with  . Shortness of Breath    (Consider location/radiation/quality/duration/timing/severity/associated sxs/prior treatment) HPI  Past Medical History  Diagnosis Date  . Bladder cancer   . Renal cancer   . Pulmonary embolus   . GERD (gastroesophageal reflux disease)   . Arthritis   . COPD (chronic obstructive pulmonary disease)   . Hypertension   . Urinary frequency   . Carpal tunnel syndrome   . Vitamin B12 deficiency   . Hiatal hernia   . Esophageal stricture   . Osteoarthritis   . Diverticulosis   . PE (pulmonary embolism)   . Choledocholithiasis   . Biliary acute pancreatitis     Past Surgical History  Procedure Date  . Inguinal hernia repair 1998    right  . Cholecystectomy   . Partial nephrectomy   . Esophagogastroduodenoscopy   . Carpal tunnel release 12/11    Right---Dr Mina Marble  . Transurethral resection of prostate     limited  . Cystostomy w/ bladder biopsy     multiple    Family History  Problem Relation Age of Onset  . Hypertension Mother   . Colon cancer Neg Hx   . Heart failure Mother   . Stroke Mother   . Stroke Brother   . Heart attack Brother   . Diabetes Sister   . Stroke Sister     History  Substance Use Topics  . Smoking status: Former Smoker -- 1.0 packs/day for 40 years    Types: Cigarettes  . Smokeless tobacco: Never Used  . Alcohol Use: No      Review of Systems  Review of symptoms negative unless otherwise noted in HPI.  Allergies  Acetaminophen  Home Medications   Current Outpatient Rx  Name Route Sig Dispense Refill  . ASPIRIN 81 MG PO  TABS  Take 2 tablets daily.    Marland Kitchen ESOMEPRAZOLE MAGNESIUM 40 MG PO CPDR  Take 1 capsule before breakfast as needed.    . IBUPROFEN 200 MG PO TABS Oral Take 400 mg by mouth 3 (three) times daily as needed.     . TRIAMTERENE-HCTZ 37.5-25 MG PO TABS Oral Take 1 each (1 tablet total) by mouth daily. 90 tablet 3    Pulse 105  Temp 100.2 F (37.9 C) (Oral)  Resp 32  SpO2 94%  Physical Exam  Nursing note and vitals reviewed. Constitutional: He appears well-developed and well-nourished. No distress.  HENT:  Head: Normocephalic and atraumatic.  Eyes: Conjunctivae are normal. Right eye exhibits no discharge. Left eye exhibits no discharge.  Neck: Neck supple.  Cardiovascular: Regular rhythm and normal heart sounds.  Exam reveals no gallop and no friction rub.   No murmur heard.      irreg irreg  Pulmonary/Chest: Effort normal. No respiratory distress. He has wheezes.       Faint expiratory wheezing b/l. Rhonchi on L. Mild tachypnea  Abdominal: Soft. He exhibits no distension. There is no tenderness.  Musculoskeletal: He exhibits no edema and no tenderness.  Neurological: He is alert.  Skin: Skin is warm and dry.  Psychiatric: He has a normal  mood and affect. His behavior is normal. Thought content normal.    ED Course  Procedures (including critical care time)  Labs Reviewed  CBC WITH DIFFERENTIAL - Abnormal; Notable for the following:    WBC 19.9 (*)     Neutrophils Relative 86 (*)     Neutro Abs 17.1 (*)     Lymphocytes Relative 4 (*)     Monocytes Absolute 1.9 (*)     All other components within normal limits  BASIC METABOLIC PANEL - Abnormal; Notable for the following:    Potassium 3.3 (*)     Chloride 94 (*)     Glucose, Bld 126 (*)     GFR calc non Af Amer 65 (*)     GFR calc Af Amer 76 (*)     All other components within normal limits  COMPREHENSIVE METABOLIC PANEL - Abnormal; Notable for the following:    Potassium 2.8 (*)     Glucose, Bld 137 (*)     Calcium 8.3 (*)      Albumin 2.3 (*)     GFR calc non Af Amer 74 (*)     GFR calc Af Amer 86 (*)     All other components within normal limits  CARDIAC PANEL(CRET KIN+CKTOT+MB+TROPI) - Abnormal; Notable for the following:    Total CK 270 (*)     CK, MB 5.6 (*)     All other components within normal limits  CBC - Abnormal; Notable for the following:    WBC 18.5 (*)     All other components within normal limits  CREATININE, SERUM - Abnormal; Notable for the following:    GFR calc non Af Amer 74 (*)     GFR calc Af Amer 85 (*)     All other components within normal limits  POTASSIUM - Abnormal; Notable for the following:    Potassium 2.9 (*)     All other components within normal limits  GLUCOSE, CAPILLARY - Abnormal; Notable for the following:    Glucose-Capillary 123 (*)     All other components within normal limits  URINALYSIS, ROUTINE W REFLEX MICROSCOPIC - Abnormal; Notable for the following:    Color, Urine AMBER (*)  BIOCHEMICALS MAY BE AFFECTED BY COLOR   Bilirubin Urine SMALL (*)     Protein, ur 30 (*)     Urobilinogen, UA 4.0 (*)     All other components within normal limits  BASIC METABOLIC PANEL - Abnormal; Notable for the following:    Glucose, Bld 116 (*)     GFR calc non Af Amer 80 (*)     All other components within normal limits  CBC - Abnormal; Notable for the following:    WBC 16.7 (*)     All other components within normal limits  TROPONIN I  MRSA PCR SCREENING  MAGNESIUM  URINE CULTURE  URINE MICROSCOPIC-ADD ON  LAB REPORT - SCANNED   No results found.  Dg Chest 2 View  06/25/2012  *RADIOLOGY REPORT*  Clinical Data: Short of breath  CHEST - 2 VIEW  Comparison: 11/08/2010  Findings: COPD with hyperinflation.  Left lower lobe infiltrate, suspicious for pneumonia.  This was not present previously.  Right lung is clear.  Negative for heart failure or effusion.  IMPRESSION: COPD with left lower lobe infiltrate, consistent with pneumonia.  Original Report Authenticated By: Camelia Phenes, M.D.   EKG:  Rhythm: sinus with intermittent afib Rate: 97 Axis: normal Intervals: normal ST segments:  NS ST changes.   1. CAP (community acquired pneumonia)   2. Hypoxia   3. Abnormal EKG   4. COPD (chronic obstructive pulmonary disease)   5. Unspecified essential hypertension   6. Acute respiratory failure with hypoxia   7. Atrial fibrillation with controlled ventricular response   8. Atrial fibrillation   9. Atrial tachycardia   10. Community acquired pneumonia   11. HYPERTENSION       MDM  86yM with sob and increasing cough. CXR reviewed and consistent with L sided pneumonia. Clinical picture consistent with this. Given hypoxia with no home 02 requirement and comorbidities, will admit.        Raeford Razor, MD 06/29/12 [phone]

## 2012-06-25 NOTE — ED Notes (Signed)
Patient has bed ready on 5500; per Dr. Toniann Fail, patient to be transferred to a step down unit.  Flow manager notified; waiting for order to be placed by Dr. Toniann Fail.

## 2012-06-25 NOTE — ED Notes (Signed)
C/o prod cough, congestion worsening SOB, DOE, fever, chills x 1 week. Denies CP, palpitations. Upon cardiac monitoring, HR irregular.

## 2012-06-25 NOTE — ED Notes (Signed)
Patient transported upstairs on portable cardiac monitor with RN.

## 2012-06-25 NOTE — ED Notes (Signed)
Received bedside report from [place], [place].  Patient currently sitting up in bed; no respiratory or acute distress noted.  Patient updated on plan of care; informed patient that we are currently waiting on a bed request to be put in.  Zithromax to be administered after Rocephin has finished infusing.  Patient has no other questions or concerns at this time; will continue to monitor.

## 2012-06-25 NOTE — ED Notes (Signed)
Calling report now. 

## 2012-06-25 NOTE — ED Notes (Signed)
Report given to Tim, RN on 3300.  No further questions/concerns at this time.  Informed RN that she can call back with any questions/concerns once patient arrives to floor.  Dr. Toniann Fail at bedside at the moment to talk to family about importance of CT Angio.  Family states that they still wants to think it over at this time.  RN aware that patient and family is refusing CT Angio at this time; preparing patient for transport.

## 2012-06-25 NOTE — Progress Notes (Signed)
Weaker recently.  Has a cough, short of breath, less exercise tolerance.  No focal weakness. Former smoker.  "I don't normally come to the doctor."  Prev 41 year smoker, quit in 1982.  H/o bladder and renal cancer prev.   PMH and SH reviewed  ROS: See HPI, otherwise noncontributory.  Meds, vitals, and allergies reviewed.   nad but mildly sob Mmm Neck supple, no LA rrr Dec BS at the bases B with crackles noted Ext w/trace edema O2 86-88% on RA, up to ~92% with 3L O2.

## 2012-06-25 NOTE — Assessment & Plan Note (Signed)
Likely PNA vs COPD exacerbation but with hypoxia I would send to ER via EMS.  Patient agreed.  911 called promptly.  >25 min spent with face to face with patient, >50% counseling and/or coordinating care.

## 2012-06-25 NOTE — ED Notes (Signed)
Dr. Toniann Fail states that he is going to cancel the CT Angio scan.

## 2012-06-25 NOTE — ED Notes (Signed)
Went to start [facility] IV line for CT angio scan; patient refusing second IV start.  Wife states that she does not feel that the CT Angio scan is necessary.  Wife understands that patient has had a history of blood clots in the past, but states that she feels that the pneumonia is causing his shortness of breath, not a blood clot.  Educated patient and family of importance of CT Angio scan and the dangers of a pulmonary embolism; wife states that she wants to "think it over for a little while before we make any decisions".  Dr. Toniann Fail aware; will continue to monitor.

## 2012-06-26 DIAGNOSIS — E876 Hypokalemia: Secondary | ICD-10-CM | POA: Insufficient documentation

## 2012-06-26 DIAGNOSIS — D72829 Elevated white blood cell count, unspecified: Secondary | ICD-10-CM | POA: Insufficient documentation

## 2012-06-26 DIAGNOSIS — J9601 Acute respiratory failure with hypoxia: Secondary | ICD-10-CM | POA: Diagnosis present

## 2012-06-26 DIAGNOSIS — J189 Pneumonia, unspecified organism: Secondary | ICD-10-CM | POA: Diagnosis present

## 2012-06-26 DIAGNOSIS — J9600 Acute respiratory failure, unspecified whether with hypoxia or hypercapnia: Secondary | ICD-10-CM

## 2012-06-26 DIAGNOSIS — I4891 Unspecified atrial fibrillation: Secondary | ICD-10-CM | POA: Insufficient documentation

## 2012-06-26 LAB — URINE MICROSCOPIC-ADD ON

## 2012-06-26 LAB — POTASSIUM: Potassium: 2.9 mEq/L — ABNORMAL LOW (ref 3.5–5.1)

## 2012-06-26 LAB — COMPREHENSIVE METABOLIC PANEL
ALT: 20 U/L (ref 0–53)
Albumin: 2.3 g/dL — ABNORMAL LOW (ref 3.5–5.2)
Alkaline Phosphatase: 79 U/L (ref 39–117)
BUN: 20 mg/dL (ref 6–23)
Potassium: 2.8 mEq/L — ABNORMAL LOW (ref 3.5–5.1)
Sodium: 138 mEq/L (ref 135–145)
Total Protein: 6 g/dL (ref 6.0–8.3)

## 2012-06-26 LAB — GLUCOSE, CAPILLARY: Glucose-Capillary: 123 mg/dL — ABNORMAL HIGH (ref 70–99)

## 2012-06-26 LAB — URINALYSIS, ROUTINE W REFLEX MICROSCOPIC
Hgb urine dipstick: NEGATIVE
Ketones, ur: NEGATIVE mg/dL
Nitrite: NEGATIVE
Protein, ur: 30 mg/dL — AB
Specific Gravity, Urine: 1.017 (ref 1.005–1.030)
Urobilinogen, UA: 4 mg/dL — ABNORMAL HIGH (ref 0.0–1.0)

## 2012-06-26 LAB — MRSA PCR SCREENING: MRSA by PCR: NEGATIVE

## 2012-06-26 MED ORDER — POTASSIUM CHLORIDE CRYS ER 20 MEQ PO TBCR
40.0000 meq | EXTENDED_RELEASE_TABLET | Freq: Two times a day (BID) | ORAL | Status: AC
  Administered 2012-06-26 (×2): 40 meq via ORAL
  Filled 2012-06-26 (×2): qty 2

## 2012-06-26 MED ORDER — POTASSIUM CHLORIDE 10 MEQ/100ML IV SOLN
10.0000 meq | INTRAVENOUS | Status: DC
  Filled 2012-06-26: qty 100

## 2012-06-26 NOTE — H&P (Addendum)
Bryan David is an 78 y.o. male.  This is an admitting history and physical for admission date June 25, 2012.  PCP - Dr.Richard Alphonsus Sias. Chief Complaint: Shortness of breath. HPI: 78 year old male with known history of COPD, hypertension and previous history of PE and x-rays and shortness of breath for the last few days. Last week patient had upper respiratory tract infection with cough and runny nose. Subsequent to that patient started to develop shortness of breath with cough and phlegm and subjective feeling of fever and chills. Denies any chest pain nausea vomiting abdominal pain or diarrhea. Patient has been feeling very weak and tired. Had gone to his PCPs office yesterday and was referred to the ER as condition was found to be hypoxic and short of breath and febrile. In the ER patient was found to be febrile with chest x-ray showing left lower lobe infiltrates and patient had leukocytosis has been admitted for community-acquired pneumonia and antibiotic started.  Past Medical History  Diagnosis Date  . Bladder cancer   . Renal cancer   . Pulmonary embolus   . GERD (gastroesophageal reflux disease)   . Arthritis   . COPD (chronic obstructive pulmonary disease)   . Hypertension   . Urinary frequency   . Carpal tunnel syndrome   . Vitamin B12 deficiency   . Hiatal hernia   . Esophageal stricture   . Osteoarthritis   . Diverticulosis   . PE (pulmonary embolism)   . Choledocholithiasis   . Biliary acute pancreatitis   . Anxiety   . Shortness of breath   . Pneumonia     Past Surgical History  Procedure Date  . Inguinal hernia repair 1998    right  . Cholecystectomy   . Partial nephrectomy   . Esophagogastroduodenoscopy   . Carpal tunnel release 12/11    Right---Dr Mina Marble  . Transurethral resection of prostate     limited  . Cystostomy w/ bladder biopsy     multiple    Family History  Problem Relation Age of Onset  . Hypertension Mother   . Colon cancer Neg Hx   .  Heart failure Mother   . Stroke Mother   . Stroke Brother   . Heart attack Brother   . Diabetes Sister   . Stroke Sister    Social History:  reports that he quit smoking about 31 years ago. His smoking use included Cigarettes. He has a 40 pack-year smoking history. He has never used smokeless tobacco. He reports that he does not drink alcohol or use illicit drugs.  Allergies:  Allergies  Allergen Reactions  . Acetaminophen Other (See Comments)    "made him feel silly"    Medications Prior to Admission  Medication Sig Dispense Refill  . aspirin EC 81 MG tablet Take 162 mg by mouth daily.      Marland Kitchen esomeprazole (NEXIUM) 40 MG capsule Take 40 mg by mouth daily before breakfast. Take 1 capsule before breakfast as needed.      Marland Kitchen ibuprofen (ADVIL,MOTRIN) 200 MG tablet Take 200 mg by mouth daily as needed. For pain      . triamterene-hydrochlorothiazide (MAXZIDE-25) 37.5-25 MG per tablet Take 1 each (1 tablet total) by mouth daily.  90 tablet  3    Results for orders placed during the hospital encounter of 06/25/12 (from the past 48 hour(s))  CBC WITH DIFFERENTIAL     Status: Abnormal   Collection Time   06/25/12  4:52 PM  Component Value Range Comment   WBC 19.9 (*) 4.0 - 10.5 K/uL    RBC 4.82  4.22 - 5.81 MIL/uL    Hemoglobin 13.7  13.0 - 17.0 g/dL    HCT 16.1  09.6 - 04.5 %    MCV 84.6  78.0 - 100.0 fL    MCH 28.4  26.0 - 34.0 pg    MCHC 33.6  30.0 - 36.0 g/dL    RDW 40.9  81.1 - 91.4 %    Platelets 186  150 - 400 K/uL    Neutrophils Relative 86 (*) 43 - 77 %    Neutro Abs 17.1 (*) 1.7 - 7.7 K/uL    Lymphocytes Relative 4 (*) 12 - 46 %    Lymphs Abs 0.9  0.7 - 4.0 K/uL    Monocytes Relative 10  3 - 12 %    Monocytes Absolute 1.9 (*) 0.1 - 1.0 K/uL    Eosinophils Relative 0  0 - 5 %    Eosinophils Absolute 0.0  0.0 - 0.7 K/uL    Basophils Relative 0  0 - 1 %    Basophils Absolute 0.0  0.0 - 0.1 K/uL   BASIC METABOLIC PANEL     Status: Abnormal   Collection Time   06/25/12   4:52 PM      Component Value Range Comment   Sodium 135  135 - 145 mEq/L    Potassium 3.3 (*) 3.5 - 5.1 mEq/L    Chloride 94 (*) 96 - 112 mEq/L    CO2 30  19 - 32 mEq/L    Glucose, Bld 126 (*) 70 - 99 mg/dL    BUN 22  6 - 23 mg/dL    Creatinine, Ser 7.82  0.50 - 1.35 mg/dL    Calcium 9.2  8.4 - 95.6 mg/dL    GFR calc non Af Amer 65 (*) >90 mL/min    GFR calc Af Amer 76 (*) >90 mL/min   TROPONIN I     Status: Normal   Collection Time   06/25/12  4:52 PM      Component Value Range Comment   Troponin I <0.30  <0.30 ng/mL   CARDIAC PANEL(CRET KIN+CKTOT+MB+TROPI)     Status: Abnormal   Collection Time   06/25/12  9:41 PM      Component Value Range Comment   Total CK 270 (*) 7 - 232 U/L    CK, MB 5.6 (*) 0.3 - 4.0 ng/mL    Troponin I <0.30  <0.30 ng/mL    Relative Index 2.1  0.0 - 2.5   CBC     Status: Abnormal   Collection Time   06/25/12  9:41 PM      Component Value Range Comment   WBC 18.5 (*) 4.0 - 10.5 K/uL    RBC 4.75  4.22 - 5.81 MIL/uL    Hemoglobin 13.5  13.0 - 17.0 g/dL    HCT 21.3  08.6 - 57.8 %    MCV 85.7  78.0 - 100.0 fL    MCH 28.4  26.0 - 34.0 pg    MCHC 33.2  30.0 - 36.0 g/dL    RDW 46.9  62.9 - 52.8 %    Platelets 183  150 - 400 K/uL   CREATININE, SERUM     Status: Abnormal   Collection Time   06/25/12  9:41 PM      Component Value Range Comment   Creatinine, Ser 0.97  0.50 - 1.35  mg/dL    GFR calc non Af Amer 74 (*) >90 mL/min    GFR calc Af Amer 85 (*) >90 mL/min    Dg Chest 2 View  06/25/2012  *RADIOLOGY REPORT*  Clinical Data: Short of breath  CHEST - 2 VIEW  Comparison: 11/08/2010  Findings: COPD with hyperinflation.  Left lower lobe infiltrate, suspicious for pneumonia.  This was not present previously.  Right lung is clear.  Negative for heart failure or effusion.  IMPRESSION: COPD with left lower lobe infiltrate, consistent with pneumonia.  Original Report Authenticated By: Camelia Phenes, M.D.    Review of Systems  Constitutional: Positive for  fever and chills.  HENT: Negative.   Eyes: Negative.   Respiratory: Positive for cough, sputum production and shortness of breath.   Cardiovascular: Negative.   Gastrointestinal: Negative.   Genitourinary: Negative.   Musculoskeletal: Negative.   Skin: Negative.   Neurological: Negative.   Endo/Heme/Allergies: Negative.   Psychiatric/Behavioral: Negative.     Blood pressure 123/71, pulse 104, temperature 97.9 F (36.6 C), temperature source Oral, resp. rate 25, height 5\' 10"  (1.778 m), weight 75.3 kg (166 lb 0.1 oz), SpO2 99.00%. Physical Exam  Constitutional: He is oriented to person, place, and time. He appears well-developed and well-nourished. No distress.  HENT:  Head: Normocephalic and atraumatic.  Right Ear: External ear normal.  Left Ear: External ear normal.  Nose: Nose normal.  Mouth/Throat: Oropharynx is clear and moist. No oropharyngeal exudate.  Eyes: Conjunctivae are normal. Pupils are equal, round, and reactive to light. Right eye exhibits no discharge. Left eye exhibits no discharge. No scleral icterus.  Neck: Normal range of motion. Neck supple.  Cardiovascular:       Tachycardia with irregular rhythm.  Respiratory: Effort normal and breath sounds normal. No respiratory distress. He has no wheezes. He has no rales.  GI: Soft. Bowel sounds are normal. He exhibits no distension. There is no tenderness. There is no rebound.  Musculoskeletal: Normal range of motion. He exhibits no edema and no tenderness.  Neurological: He is alert and oriented to person, place, and time.       Moves all extremities.  Skin: Skin is warm and dry. He is not diaphoretic.  Psychiatric: His behavior is normal.     Assessment/Plan #1. Community-acquired pneumonia - patient has been started on ceftriaxone and azithromycin which we will continue. Patient does have history of PE when he had a cholecystectomy but this time I feel his symptoms are more consistent pneumonia. Patient is also not  very inclined moment to get a CT chest but if symptoms worsen or persist may consider CT angiogram of the chest. We'll observe him overnight in step down unit as patient is mildly tachypneic. #2. Tachycardia with abnormal rhythm - I have discussed the EKG with the cardiologist on call Dr. Terressa Koyanagi. As per the cardiologist this most likely could be sinus tachycardia with PAC versus atrial fibrillation. We will for now observe in telemetry get a 2-D echo and closely follow his rhythm. #3. History of hypertension - for now we'll discontinue HCTZ and am hydrating the patient. Keep patient on when necessary labetalol IV for systolic blood pressure 160. #4. History of bladder cancer. #5. History of COPD - place patient on nebulizers. Patient is not wheezing presently.   CODE STATUS - full code.  Chrystian Cupples N. 06/26/2012, 12:40 AM

## 2012-06-26 NOTE — Progress Notes (Signed)
Visited pt on floor rounds.  Introduced myself to pt and offered pastoral support and presence.  Shared conversation about life and pt joy in his produce garden.  Pt weepy at times, but appreciative of life and care here at the hospital.  Offered to pray for pt, which he accepted.  Offered to be available to talk as needed.  Pt thanked me for my presence and visit.  Will follow-up as needed or requested.

## 2012-06-26 NOTE — Progress Notes (Signed)
Utilization review completed.  

## 2012-06-26 NOTE — Progress Notes (Signed)
[facility] Progress Note [place] TEAM 1 - Stepdown/ICU TEAM   Bryan David UVO:[id number] DOB: 1925-04-14 DOA: 06/25/2012 PCP: Tillman Abide, MD  Brief narrative: 78 year old male with known history of COPD, hypertension and previous history of PE and x-rays and shortness of breath for the last few days. Last week patient had upper respiratory tract infection with cough and runny nose. Subsequent to that patient started to develop shortness of breath with cough and phlegm and subjective feeling of fever and chills. Denies any chest pain nausea vomiting abdominal pain or diarrhea. Patient has been feeling very weak and tired. Had gone to his PCPs office yesterday and was referred to the ER as condition was found to be hypoxic and short of breath and febrile. In the ER patient was found to be febrile with chest x-ray showing left lower lobe infiltrates and patient had leukocytosis has been admitted for community-acquired pneumonia and antibiotic started.  Assessment/Plan: Principal Problem:  * Acute respiratory failure with hypoxia due to CAP (community acquired pneumonia)- left lower lobe *Patient developed acute on chronic shortness of breath worse over the past 2 weeks associated with subjective fevers and productive cough *Chest x-ray consistent with left lower lobe pneumonia *Continue supportive care and oxygen and nebs *Continue empiric therapy with Rocephin and Zithromax  Active Problems:  Atrial fibrillation with controlled ventricular response (NEW)/ Abnormal EKG *No prior documented history of atrial fibrillation *Current rhythms have varied between atrial fibrillation, sinus rhythm with frequent PACs, and multifocal atrial rhythm *Check 2-D echocardiogram *May need to consider cardiology consult *Given patient's advanced age likely not a candidate for chronic systemic Coumadin anticoagulation *Suspect etiology is transient and related to volume depletion and stress of acute  illness   HYPERTENSION *Blood pressure is actually low and has ranged between systolic in the 80s and systolic in the low 100's *At home normally takes Maxzide which is now on hold  Dehydration secondary to acute illness *Continue  IV fluid hydration   ESOPHAGEAL STRICTURE/GERD (gastroesophageal reflux disease) *Concern with apparent esophageal stricture patient may have severe reflux and possible aspiration causing pneumonia *Patient with no overt signs and symptoms of aspiration while eating this morning and he has subsequently undergone clinical bedside swallowing evaluation with speech therapy who found a primary esophageal dysphagia and diet recommendation was for regular with thin liquids *Continue home PPI   COPD (chronic obstructive pulmonary disease)/chronic dyspnea *Currently not exacerbated *Patient heavy smoker 2-1/2 packs for greater than 40 years but quit 20 years prior *Concern that patient may be evolving into more severe COPD and may have developed pulmonary hypertension and/or cor pulmonale *2-D echocardiogram will help clarify   Hypokalemia with normal acid-base balance *Oral repletion and follow electrolyte panel   Leukocytosis *But primarily due to keep pneumonia process   VITAMIN B12 DEFICIENCY    Code Status: Had discussion both with patient and wife today at the bedside. Patient adamant about DO NOT RESUSCITATE status but wife was reluctant and wanted to discuss with the children. Patient explained "it is my body and I do not wish to be resuscitated, it is okay to try to fix stuff but don't put me on life support" at this point order for DO NOT RESUSCITATE order was written with the patient's approval. Family Communication: Discuss patient's status post with the patient and with his wife and all questions were answered appear Disposition Plan: Remain in step down for further monitoring over the next 24 hours as we are concerned with appropriate rehydration  patient's respiratory symptoms may actually worsen.  Consultants: None  Procedures: None  Antibiotics: Rocephin 7/23 >>> Zithromax 7/23 >>>  HPI/Subjective: Patient alert and denies current issues with shortness of breath or cough. He clarifies he has had chronic dyspnea for several years but has worsened over the past few weeks and was associated with cough and subjective fevers. Patient tends to minimize his symptoms and freely admits he prefers trying to get better at home as opposed to his seeking out medical attention.   Objective: Blood pressure 98/53, pulse 67, temperature 98.2 F (36.8 C), temperature source Oral, resp. rate 28, height 5\' 10"  (1.778 m), weight 75.2 kg (165 lb 12.6 oz), SpO2 96.00%.  Intake/Output Summary (Last 24 hours) at 06/26/12 1503 Last data filed at 06/26/12 1303  Gross per 24 hour  Intake 1078.33 ml  Output   1100 ml  Net -21.67 ml     Exam: General: No acute respiratory distress, appears stated age Lungs:  bibasilar crackles more prominent left base, on nasal cannula oxygen 2 L per minute with saturations 96% Cardiovascular: Regular rate and rhythm without murmur gallop or rub normal S1 and S2 Abdomen: Nontender, nondistended, soft, bowel sounds positive, no rebound, no ascites, no appreciable mass Musculoskeletal: No significant cyanosis, clubbing of extremities Neurological: Patient alert and oriented x4, moving all extremities x4, exam non focal  Data Reviewed: Basic Metabolic Panel:  Lab 06/26/12 4098 06/26/12 0324 06/25/12 2141 06/25/12 1652  NA -- 138 -- 135  K 2.9* 2.8* -- 3.3*  CL -- 100 -- 94*  CO2 -- 27 -- 30  GLUCOSE -- 137* -- 126*  BUN -- 20 -- 22  CREATININE -- 0.96 0.97 1.02  CALCIUM -- 8.3* -- 9.2  MG 2.2 -- -- --  PHOS -- -- -- --   Liver Function Tests:  Lab 06/26/12 0324  AST 30  ALT 20  ALKPHOS 79  BILITOT 0.9  PROT 6.0  ALBUMIN 2.3*   No results found for this basename: LIPASE:5,AMYLASE:5 in the last  168 hours No results found for this basename: AMMONIA:5 in the last 168 hours CBC:  Lab 06/25/12 2141 06/25/12 1652  WBC 18.5* 19.9*  NEUTROABS -- 17.1*  HGB 13.5 13.7  HCT 40.7 40.8  MCV 85.7 84.6  PLT 183 186   Cardiac Enzymes:  Lab 06/25/12 2141 06/25/12 1652  CKTOTAL 270* --  CKMB 5.6* --  CKMBINDEX -- --  TROPONINI <0.30 <0.30   BNP (last 3 results) No results found for this basename: PROBNP:3 in the last 8760 hours CBG:  Lab 06/26/12 0717  GLUCAP 123*    Recent Results (from the past 240 hour(s))  MRSA PCR SCREENING     Status: Normal   Collection Time   06/26/12 12:25 AM      Component Value Range Status Comment   MRSA by PCR NEGATIVE  NEGATIVE Final      Studies:  Recent x-ray studies have been reviewed in detail by the Attending Physician  Scheduled Meds:  Reviewed in detail by the Attending Physician   Junious Silk, ANP [facility] Office  [phone] Pager [phone]  On-Call/Text Page:      [email or web address]      password [email or web address]  If 7PM-7AM, please contact night-coverage [email or web address] Password [email or web address] 06/26/2012, 3:03 PM   LOS: 1 day      I have examined the patient, reviewed the chart and agree with the above note which I have modified.   Calvert Cantor, MD [phone]

## 2012-06-26 NOTE — Evaluation (Signed)
Clinical/Bedside Swallow Evaluation Patient Details  Name: Bryan David MRN: [id number] Date of Birth: [birth year]  Today's Date: 06/26/2012 Time: 1100-1120 SLP Time Calculation (min): 20 min  Past Medical History:  Past Medical History  Diagnosis Date  . Bladder cancer   . Renal cancer   . Pulmonary embolus   . GERD (gastroesophageal reflux disease)   . Arthritis   . COPD (chronic obstructive pulmonary disease)   . Hypertension   . Urinary frequency   . Carpal tunnel syndrome   . Vitamin B12 deficiency   . Hiatal hernia   . Esophageal stricture   . Osteoarthritis   . Diverticulosis   . PE (pulmonary embolism)   . Choledocholithiasis   . Biliary acute pancreatitis   . Anxiety   . Shortness of breath   . Pneumonia    Past Surgical History:  Past Surgical History  Procedure Date  . Inguinal hernia repair 1998    right  . Cholecystectomy   . Partial nephrectomy   . Esophagogastroduodenoscopy   . Carpal tunnel release 12/11    Right---Dr Mina Marble  . Transurethral resection of prostate     limited  . Cystostomy w/ bladder biopsy     multiple   HPI:  78 year old male with known history of GERD, Hiatal Hernia, Esophageal stricture with dilation in 2002, COPD, hypertension and previous history of PE and x-rays and shortness of breath for the last few days. Last week patient had upper respiratory tract infection with cough and runny nose. Subsequent to that patient started to develop shortness of breath with cough and phlegm and subjective feeling of fever and chills. Denies any chest pain nausea vomiting abdominal pain or diarrhea. Patient has been feeling very weak and tired. Had gone to his PCPs office yesterday and was referred to the ER as condition was found to be hypoxic and short of breath and febrile. In the ER patient was found to be febrile with chest x-ray showing left lower lobe infiltrates and patient had leukocytosis has been admitted for community-acquired pneumonia  and antibiotic started   Assessment / Plan / Recommendation Clinical Impression  Patient presents with a functional oropharyngeal swallow. Suspect primary esophageal dysphagia given h/o GERD, esophageal strictures s/p dilations, and intermittent c/o globus. Patient reports however that he takes Nexium which minimizes symptoms and he does not report any difficulty swallowing since most recent esophageal dilation. Aspiration risk mild-moderate given esophageal history and advanced age. Patient indepedent with aspiration precautions. No further skilled SLP needs indicated at this time. If PNa becomes recurrent, patient may benefit from f/u objective swallowing evaluations.     Aspiration Risk  Moderate (mild-mod)    Diet Recommendation Regular;Thin liquid   Liquid Administration via: Cup;Straw Medication Administration: Whole meds with liquid Supervision: Patient able to self feed;Intermittent supervision to cue for compensatory strategies Compensations: Slow rate;Small sips/bites;Follow solids with liquid Postural Changes and/or Swallow Maneuvers: Seated upright 90 degrees;Upright 30-60 min after meal    Other  Recommendations Oral Care Recommendations: Oral care BID   Follow Up Recommendations  None       Pertinent Vitals/Pain n/a     Swallow Study    General HPI: 78 year old male with known history of GERD, Hiatal Hernia, Esophageal stricture with dilation in 2002, COPD, hypertension and previous history of PE and x-rays and shortness of breath for the last few days. Last week patient had upper respiratory tract infection with cough and runny nose. Subsequent to that patient started to develop shortness of  breath with cough and phlegm and subjective feeling of fever and chills. Denies any chest pain nausea vomiting abdominal pain or diarrhea. Patient has been feeling very weak and tired. Had gone to his PCPs office yesterday and was referred to the ER as condition was found to be hypoxic  and short of breath and febrile. In the ER patient was found to be febrile with chest x-ray showing left lower lobe infiltrates and patient had leukocytosis has been admitted for community-acquired pneumonia and antibiotic started Type of Study: Bedside swallow evaluation Previous Swallow Assessment: h/o dilation x2, most recent 3-4 years ago per wife Diet Prior to this Study: Regular;Thin liquids Temperature Spikes Noted: No Respiratory Status: Supplemental O2 delivered via (comment) (2 L nasal cannula) History of Recent Intubation: No Behavior/Cognition: Alert;Cooperative;Pleasant mood Oral Cavity - Dentition: Dentures, top Self-Feeding Abilities: Able to feed self Patient Positioning: Upright in bed Baseline Vocal Quality: Clear Volitional Cough: Strong Volitional Swallow: Able to elicit    Oral/Motor/Sensory Function Overall Oral Motor/Sensory Function: Appears within functional limits for tasks assessed   Ice Chips Ice chips: Not tested   Thin Liquid Thin Liquid: Impaired Presentation: Cup;Straw Pharyngeal  Phase Impairments: Multiple swallows (intermittently)    Nectar Thick Nectar Thick Liquid: Not tested   Honey Thick Honey Thick Liquid: Not tested   Puree Puree: Within functional limits Presentation: Spoon;Self Fed   Solid Solid: Within functional limits Presentation: Self [employer] MA, CCC-SLP [phone]  Bryan David Bryan David 06/26/2012,11:22 AM

## 2012-06-27 ENCOUNTER — Encounter (HOSPITAL_COMMUNITY): Payer: Self-pay | Admitting: Physician Assistant

## 2012-06-27 DIAGNOSIS — I491 Atrial premature depolarization: Secondary | ICD-10-CM

## 2012-06-27 DIAGNOSIS — I471 Supraventricular tachycardia: Secondary | ICD-10-CM

## 2012-06-27 DIAGNOSIS — I4719 Other supraventricular tachycardia: Secondary | ICD-10-CM

## 2012-06-27 DIAGNOSIS — I359 Nonrheumatic aortic valve disorder, unspecified: Secondary | ICD-10-CM

## 2012-06-27 DIAGNOSIS — I4891 Unspecified atrial fibrillation: Secondary | ICD-10-CM | POA: Insufficient documentation

## 2012-06-27 LAB — URINE CULTURE: Culture: NO GROWTH

## 2012-06-27 MED ORDER — POTASSIUM CHLORIDE CRYS ER 20 MEQ PO TBCR
40.0000 meq | EXTENDED_RELEASE_TABLET | Freq: Two times a day (BID) | ORAL | Status: DC
  Administered 2012-06-27 – 2012-06-28 (×2): 40 meq via ORAL
  Filled 2012-06-27 (×4): qty 2

## 2012-06-27 MED ORDER — CYANOCOBALAMIN 1000 MCG/ML IJ SOLN
1000.0000 ug | Freq: Every day | INTRAMUSCULAR | Status: DC
  Administered 2012-06-27 – 2012-06-28 (×2): 1000 ug via SUBCUTANEOUS
  Filled 2012-06-27 (×3): qty 1

## 2012-06-27 MED ORDER — LORAZEPAM 0.5 MG PO TABS
0.5000 mg | ORAL_TABLET | Freq: Four times a day (QID) | ORAL | Status: DC | PRN
  Administered 2012-06-27: 0.5 mg via ORAL
  Filled 2012-06-27: qty 1

## 2012-06-27 MED ORDER — POTASSIUM CHLORIDE CRYS ER 20 MEQ PO TBCR
40.0000 meq | EXTENDED_RELEASE_TABLET | Freq: Once | ORAL | Status: AC
  Administered 2012-06-27: 40 meq via ORAL

## 2012-06-27 NOTE — Progress Notes (Signed)
[facility] Progress Note [place] TEAM 1 - Stepdown/ICU TEAM   CHUCKY HOMES ZOX:[id number] DOB: May 04, 1925 DOA: 06/25/2012 PCP: Tillman Abide, MD  Brief narrative: 78 year old male with known history of COPD, hypertension and previous history of PE presented with shortness of breath for the last few days. Last week patient had upper respiratory tract infection with cough and runny nose. Subsequent to that patient started to develop shortness of breath with cough and phlegm and subjective feeling of fever and chills. Denies any chest pain nausea vomiting abdominal pain or diarrhea. Patient has been feeling very weak and tired. Had gone to his PCPs office and was referred to the ER as condition was found to be hypoxic and short of breath and febrile. In the ER patient was found to be febrile with chest x-ray showing left lower lobe infiltrates and patient had leukocytosis has been admitted for community-acquired pneumonia and antibiotic started.  Assessment/Plan:  Acute respiratory failure with hypoxia due to CAP (community acquired pneumonia)- left lower lobe *Patient developed acute on chronic shortness of breath worse over the past 2 weeks associated with subjective fevers and productive cough *Chest x-ray consistent with left lower lobe pneumonia *Continue supportive care and nebs-has been successfully weaned from oxygen *Continue empiric therapy with Rocephin and Zithromax day #2  Atrial fibrillation with controlled ventricular response (NEW)/ Abnormal EKG *No prior documented history of atrial fibrillation *Current rhythms have varied between atrial fibrillation, sinus rhythm with frequent PACs, and multifocal atrial rhythm *Followup on 2-D echocardiogram *Consult cardiology 06/27/2012 *[place] score =2 and Chads 2 Vasc score= 5 which places the patient between a 4-7% stroke risk annually-Dr. Sharon Seller discussed the possibility of Coumadin therapy with the patient and his wife outlining  the risks and benefits of this therapy in regards to stroke prophylaxis. At this time patient is reluctant to begin anticoagulation but wishes to think about it further. *Suspect etiology is transient and related to volume depletion and stress of acute illness  HYPERTENSION *Blood pressure is actually low and has ranged between systolic in the 80s and systolic in the low 100's *At home normally takes Maxzide which is now on hold  Dehydration secondary to acute illness *Normal saline lock IV fluids  Situational anxiety *Patient has become quite nervous over all these new changes in his health and is noted to be anxious. The nurse called stating the patient needed medication for anxiety therefore low dose when necessary Ativan was ordered  ESOPHAGEAL STRICTURE/GERD (gastroesophageal reflux disease) *Concern with apparent esophageal stricture patient may have severe reflux and possible aspiration causing pneumonia *Patient with no overt signs and symptoms of aspiration while eating this morning and he has subsequently undergone clinical bedside swallowing evaluation with speech therapy who found a primary esophageal dysphagia and diet recommendation was for regular with thin liquids *Continue home PPI  COPD (chronic obstructive pulmonary disease)/chronic dyspnea *Currently not exacerbated *Patient heavy smoker 2-1/2 packs for greater than 40 years but quit 20 years prior *Concern that patient may be evolving into more severe COPD and may have developed pulmonary hypertension and/or cor pulmonale *2-D echocardiogram will help clarify  Hypokalemia with normal acid-base balance *Oral repletion and follow electrolyte panel  Leukocytosis *But primarily due to keep pneumonia process  VITAMIN B12 DEFICIENCY On replacement  Code Status: Had discussion both with patient and wife on 06/26/2012. Patient adamant about DO NOT RESUSCITATE status but wife was reluctant and wanted to discuss with the  children. Patient explained "it is my body and I  do not wish to be resuscitated, it is okay to try to fix stuff but don't put me on life support" at this point order for DO NOT RESUSCITATE order was written with the patient's approval. Family Communication: Sharon Seller updated the patient and wife about current treatment plans and options including possibility of initiating Coumadin anticoagulation as well as need for cardiology consultation. Disposition Plan:  Transfer to telemetry   Consultants: [facility] 06/27/2012  Procedures: None  Antibiotics: Rocephin 7/23 >>> Zithromax 7/23 >>>  HPI/Subjective: Endorses acute shortness of breath and cough have resolved. Continues to report issues with chronic dyspnea and weakness with activity ongoing for at least one year and has become progressively worse.   Objective: Blood pressure 131/66, pulse 76, temperature 98.4 F (36.9 C), temperature source Oral, resp. rate 26, height 5\' 10"  (1.778 m), weight 76 kg (167 lb 8.8 oz), SpO2 96.00%.  Intake/Output Summary (Last 24 hours) at 06/27/12 1332 Last data filed at 06/27/12 0700  Gross per 24 hour  Intake      0 ml  Output   1326 ml  Net  -1326 ml     Exam: General: No acute respiratory distress, appears stated age Lungs: Previous bibasilar crackles that were more prominent in the left base have resolved, now on room air Cardiovascular: Irregular rate and rhythm without murmur gallop or rub normal S1 and S2 Abdomen: Nontender, nondistended, soft, bowel sounds positive, no rebound, no ascites, no appreciable mass Musculoskeletal: No significant cyanosis, clubbing of extremities Neurological: Patient alert and oriented x4, moving all extremities x4, exam non focal  Data Reviewed: Basic Metabolic Panel:  Lab 06/26/12 1610 06/26/12 0324 06/25/12 2141 06/25/12 1652  NA -- 138 -- 135  K 2.9* 2.8* -- 3.3*  CL -- 100 -- 94*  CO2 -- 27 -- 30  GLUCOSE -- 137* -- 126*  BUN -- 20 -- 22    CREATININE -- 0.96 0.97 1.02  CALCIUM -- 8.3* -- 9.2  MG 2.2 -- -- --  PHOS -- -- -- --   Liver Function Tests:  Lab 06/26/12 0324  AST 30  ALT 20  ALKPHOS 79  BILITOT 0.9  PROT 6.0  ALBUMIN 2.3*   CBC:  Lab 06/25/12 2141 06/25/12 1652  WBC 18.5* 19.9*  NEUTROABS -- 17.1*  HGB 13.5 13.7  HCT 40.7 40.8  MCV 85.7 84.6  PLT 183 186   Cardiac Enzymes:  Lab 06/25/12 2141 06/25/12 1652  CKTOTAL 270* --  CKMB 5.6* --  CKMBINDEX -- --  TROPONINI <0.30 <0.30   CBG:  Lab 06/26/12 0717  GLUCAP 123*    Recent Results (from the past 240 hour(s))  MRSA PCR SCREENING     Status: Normal   Collection Time   06/26/12 12:25 AM      Component Value Range Status Comment   MRSA by PCR NEGATIVE  NEGATIVE Final   URINE CULTURE     Status: Normal   Collection Time   06/26/12 10:29 AM      Component Value Range Status Comment   Specimen Description URINE, CLEAN CATCH   Final    Special Requests NONE   Final    Culture  Setup Time 06/26/2012 11:18   Final    [place] Count NO GROWTH   Final    Culture NO GROWTH   Final    Report Status 06/27/2012 FINAL   Final      Studies:  Recent x-ray studies have been reviewed in detail by the Attending  Physician  Scheduled Meds:  Reviewed in detail by the Attending Physician   Junious Silk, ANP [facility] Office  [phone] Pager [phone]  On-Call/Text Page:      [email or web address]      password [email or web address]  If 7PM-7AM, please contact night-coverage [email or web address] Password [email or web address] 06/27/2012, 1:32 PM   LOS: 2 days   I have personally examined this patient and reviewed the entire database. I have reviewed the above note, made any necessary editorial changes, and agree with its content.  Lonia Blood, MD [facility]

## 2012-06-27 NOTE — Consult Note (Signed)
CARDIOLOGY CONSULT NOTE   Patient ID: Bryan David MRN: [id number] DOB/AGE: February 28, 1925 78 y.o.  Admit date: 06/25/2012  Primary Physician   Tillman Abide, MD Primary Cardiologist   New consult Reason for Consultation  ?Atrial fibrillation  HPI:Bryan David is a 78 y.o. male with PMH HTN, COPD, PE, bladder and renal cancer with no known history of CAD that we are asked to see in consultation for newly diagnosed atrial fibrillation. He denies history of CAD, atrial fibrillation and CHF. Echo in 2003 revealed EF 55-60% with no WMA. He was admitted 2 days ago for probable community acquired pneumonia. This history is obtained from the patient and the patient's wife who is at bedside.   Bryan David was in his usual state of health until approximately 10 days ago when he developed symptoms of an upper respiratory tract infection that included a cough and nasal congestion. His symptoms worsened to include a cough productive for yellow phlegm and increased shortness of breath. His cough and SOB worsened over 2-3 days and on Monday, 06/25/12 he presented to his PCP for evaluation and was found to have O2 saturation of 86-88%. His PCP recommended he present to the ED and the patient agreed. He was transported via EMS to the [facility] ED Monday afternoon. The patient denies chest pain, palpitations, syncope, dizziness, abdominal pain, pain in his legs, and recent long distance travel.   In the ED the patient was found to be in an irregular rhythm. Workup thus far has included an EKG that shows atrial fibrillation vs. ?atrial activity with HR 97; peak CK-MB 5.6, peak CK 270 and Troponin I negative x 1; CXR revealing left lower lobe infiltrate suspicious for pneumonia, COPD with hyperinflation, and negative for heart failure or effusion; TSH 1.07; Echo is pending. Telemetry currently reveals a-fib with HR in the 70's-80's.   Past Medical History  Diagnosis Date  . Bladder cancer   . Renal cancer   . Pulmonary  embolus   . GERD (gastroesophageal reflux disease)   . Arthritis   . COPD (chronic obstructive pulmonary disease)   . Hypertension   . Urinary frequency   . Carpal tunnel syndrome   . Vitamin B12 deficiency   . Hiatal hernia   . Esophageal stricture   . Osteoarthritis   . Diverticulosis   . PE (pulmonary embolism)   . Choledocholithiasis   . Biliary acute pancreatitis   . Anxiety   . Shortness of breath   . Pneumonia      Past Surgical History  Procedure Date  . Inguinal hernia repair 1998    right  . Cholecystectomy   . Partial nephrectomy   . Esophagogastroduodenoscopy   . Carpal tunnel release 12/11    Right---Dr Mina Marble  . Transurethral resection of prostate     limited  . Cystostomy w/ bladder biopsy     multiple    Allergies  Allergen Reactions  . Acetaminophen Other (See Comments)    "made him feel silly"    I have reviewed the patient's current medications    . albuterol  2.5 mg Nebulization Q6H  . aspirin EC  162 mg Oral Daily  . azithromycin  500 mg Intravenous Q24H  . cefTRIAXone (ROCEPHIN)  IV  1 g Intravenous Q24H  . enoxaparin (LOVENOX) injection  40 mg Subcutaneous Q24H  . ipratropium  0.5 mg Nebulization Q6H  . pantoprazole  40 mg Oral Daily  . potassium chloride  40 mEq Oral BID  .  potassium chloride  40 mEq Oral BID  . sodium chloride  3 mL Intravenous Q12H      . sodium chloride 100 mL/hr at 06/26/12 2000   albuterol, labetalol, LORazepam, ondansetron (ZOFRAN) IV, ondansetron  Prior to Admission medications   Medication Sig Start Date End Date Taking? Authorizing Provider  aspirin EC 81 MG tablet Take 162 mg by mouth daily.   Yes Historical Provider, MD  esomeprazole (NEXIUM) 40 MG capsule Take 40 mg by mouth daily before breakfast. Take 1 capsule before breakfast as needed. 09/23/11  Yes Mardella Layman, MD  ibuprofen (ADVIL,MOTRIN) 200 MG tablet Take 200 mg by mouth daily as needed. For pain   Yes Historical Provider, MD    triamterene-hydrochlorothiazide (MAXZIDE-25) 37.5-25 MG per tablet Take 1 each (1 tablet total) by mouth daily. 11/02/11  Yes Karie Schwalbe, MD   History   Social History  . Marital Status: Married    Spouse Name: N/A    Number of Children: 2  . Years of Education: N/A   Occupational History  . retired - [employer], still does some produce    Social History Main Topics  . Smoking status: Former Smoker -- 1.0 packs/day for 40 years    Types: Cigarettes    Quit date: 06/25/1981  . Smokeless tobacco: Never Used  . Alcohol Use: No  . Drug Use: No  . Sexually Active: Not on file   Other Topics Concern  . Not on file   Social History Narrative   Enjoys rabbit hunting..No living will..Asks for wife as health care POA..Willing to try resuscitation but no prolonged machines..Not sure about feeding tube    Family History  Problem Relation Age of Onset  . Hypertension Mother   . Colon cancer Neg Hx   . Heart failure Mother   . Stroke Mother   . Stroke Brother   . Heart attack Brother   . Diabetes Sister   . Stroke Sister      ROS: Pt may have been febrile at home and temp was slightly elevated at PCP office (approx 99 degrees F).  Full 14 point review of systems complete and found to be negative unless listed above.  Physical Exam: Blood pressure 142/81, pulse 76, temperature 98.7 F (37.1 C), temperature source Oral, resp. rate 26, height 5\' 10"  (1.778 m), weight 167 lb 8.8 oz (76 kg), SpO2 95.00%.  General: Well developed, well nourished, male with mildly increased respiratory effort. +Agitated during interview.  Head: Eyes PERRLA, No xanthomas.   Normocephalic and atraumatic, oropharynx without edema or exudate. Dentition: poor Lungs: decreased inspiratory phase with wheezes throughout lung field. Mildly increased respiratory effort.  Heart:Slightly irregular rate and rhythm with S1, S2. No murmurs, rubs or gallops. Pulses are 2+ and symmetric in bilateral  radial/DP/PT.   Neck: No carotid bruits. No lymphadenopathy. No evidence of JVD.  Abdomen: Bowel sounds present, abdomen soft and non-tender without masses or hernias noted. Msk:  No spine or cva tenderness. No weakness, no joint deformities or effusions. Extremities: No clubbing or cyanosis.  No edema.  Neuro: Alert and oriented X 3. No focal deficits noted. Psych:  Good affect, responds appropriately, agitated.  Skin: No rashes or lesions noted.  Labs:   Lab Results  Component Value Date   WBC 18.5* 06/25/2012   HGB 13.5 06/25/2012   HCT 40.7 06/25/2012   MCV 85.7 06/25/2012   PLT 183 06/25/2012   No results found for this basename: INR in the  last 72 hours  Lab 06/26/12 1134 06/26/12 0324  NA -- 138  K 2.9* --  CL -- 100  CO2 -- 27  BUN -- 20  CREATININE -- 0.96  CALCIUM -- 8.3*  PROT -- 6.0  BILITOT -- 0.9  ALKPHOS -- 79  ALT -- 20  AST -- 30  GLUCOSE -- 137*   Magnesium  Date Value Range Status  06/26/2012 2.2  1.5 - 2.5 mg/dL Final    Basename 45/40/98 2141 06/25/12 1652  CKTOTAL 270* --  CKMB 5.6* --  CKMB Index 2.1   TROPONINI <0.30 <0.30    Echo: pending.   ECG: Possible Atrial fibrillation with ?atrial activity, incomplete RBBB, HR 97  Radiology:   1. CXR: 06/25/2012 Clinical Data: Short of breath  CHEST - 2 VIEW  Comparison: 11/08/2010  Findings: COPD with hyperinflation.  Left lower lobe infiltrate, suspicious for pneumonia.  This was not present previously.  Right lung is clear.  Negative for heart failure or effusion.  IMPRESSION: COPD with left lower lobe infiltrate, consistent with pneumonia.  Original Report Authenticated By: Camelia Phenes, M.D.   ASSESSMENT AND PLAN:   The patient was seen today by Hillis Range, MD, the patient evaluated and the data reviewed.   1. Paroxysmal atrial fibrillation with controlled ventricular response: Possible intermittent a-fib vs. Atrial tachycardia. No definitive atrial fibrillation seen. Patient is asymptomatic  with no previous documented history of A-fib, CAD or CHF. HR now is steady in the 70's-80's; Echo results pending, CE negative x 1, TSH normal in November 2012. Patient currently treated for community acquired pneumonia which may be the underlying cause of his arrhythmia. Rate control not needed at this time. Consider Coumadin given patient's history of PE. Event monitor discussed with patient. If echo normal, cardiology will see again PRN. If pt agrees to event monitor/followup with cardiology, please contact us and we will arrange.   2. Community acquired pneumonia: CXR reveals LLL infiltrate. Continue Rocephin and Zithromax per medicine management.   3. COPD: symptoms likely due to CAP not COPD exacerbation. Continue management per medicine.   4. Hypokalemia: 120 meq supplement ordered per Primary MD  Otherwise, per Primary Care:  VITAMIN B12 DEFICIENCY  HYPERTENSION  ESOPHAGEAL STRICTURE  GERD (gastroesophageal reflux disease)  Abnormal EKG  Hypokalemia with normal acid-base balance  Leukocytosis  Atrial fibrillation with controlled ventricular response  Acute respiratory failure with hypoxia   Signed: Theodore Demark 06/27/2012, 2:49 PM Co-Sign MD  I have seen, examined the patient, and reviewed the above assessment and plan.  Changes to above are made where necessary.  Pt with nonsustained atrial tachycardia, pacs, and possibly afib (not well documented) in the setting of pneumonia.  Currently doing well and without symptoms of arrhythmia. Echo is pending.  If echo is normal, then I would anticipate discharge with outpatient event monitor to further evaluate for arrhythmias.  IF he has afib outside of the setting of pneumonia, then anticoagulation would be reasonable, either with coumadin or xarelto.  Presently, he is not sure that he wishes to have an event monitor placed.  He will continue to think about it. If normal echo, he can be discharged with outpatient follow-up and further  discussion by his PCP.  Co Sign: Hillis Range, MD 06/27/2012 5:28 PM

## 2012-06-27 NOTE — Progress Notes (Signed)
Pt to be TX to 3W(fomally 3700), VSS, had BM & voids adeq. Amber urine, room air.

## 2012-06-27 NOTE — Progress Notes (Signed)
Follow--up visit to pt rm per request.  Introduced myself to pt wife and pt.  Offered pastoral presence and conversation.  Pt struggling through meaning of health diagnosis, and how severe their impact may be on his life.  Talked through spectrum of health impacts, and offered encouragement for pt and wife to consider positive outcomes.  Pt still grieving loss of a long-time friend recently.  Suggested ways of seeing the treatment options as ways of getting back to the meaningful elements of his life.   They invited me to pray for them, which we did together.   Pt and wife thanked me for my time and conversation.  Will follow-up as needed or requested.

## 2012-06-27 NOTE — Progress Notes (Signed)
*  PRELIMINARY RESULTS* Echocardiogram 2D Echocardiogram has been performed.  Jeryl Columbia 06/27/2012, 11:51 AM

## 2012-06-28 DIAGNOSIS — I498 Other specified cardiac arrhythmias: Secondary | ICD-10-CM

## 2012-06-28 LAB — BASIC METABOLIC PANEL
BUN: 14 mg/dL (ref 6–23)
Calcium: 9 mg/dL (ref 8.4–10.5)
Creatinine, Ser: 0.79 mg/dL (ref 0.50–1.35)
GFR calc Af Amer: >90 mL/min (ref >90)
GFR calc non Af Amer: 80 mL/min — ABNORMAL LOW (ref >90)

## 2012-06-28 LAB — CBC
HCT: 40 % (ref 39.0–52.0)
MCHC: 33 g/dL (ref 30.0–36.0)
Platelets: 259 10*3/uL (ref 150–400)
RDW: 13.9 % (ref 11.5–15.5)

## 2012-06-28 MED ORDER — LEVOFLOXACIN 750 MG PO TABS: 750.0000 mg | ORAL_TABLET | Freq: Every day | ORAL | Status: AC

## 2012-06-28 MED ORDER — ALBUTEROL SULFATE HFA 108 (90 BASE) MCG/ACT IN AERS: 2.0000 | INHALATION_SPRAY | Freq: Four times a day (QID) | RESPIRATORY_TRACT | Status: DC | PRN

## 2012-06-28 MED ORDER — ALBUTEROL SULFATE (5 MG/ML) 0.5% IN NEBU
2.5000 mg | INHALATION_SOLUTION | Freq: Two times a day (BID) | RESPIRATORY_TRACT | Status: DC
  Administered 2012-06-28: 2.5 mg via RESPIRATORY_TRACT
  Filled 2012-06-28: qty 0.5

## 2012-06-28 MED ORDER — LEVOFLOXACIN 750 MG PO TABS
750.0000 mg | ORAL_TABLET | Freq: Every day | ORAL | Status: DC
  Filled 2012-06-28: qty 1

## 2012-06-28 MED ORDER — IPRATROPIUM BROMIDE 0.02 % IN SOLN
0.5000 mg | Freq: Two times a day (BID) | RESPIRATORY_TRACT | Status: DC
  Administered 2012-06-28: 0.5 mg via RESPIRATORY_TRACT
  Filled 2012-06-28: qty 2.5

## 2012-06-28 NOTE — Progress Notes (Signed)
[facility] PROGRESS NOTE  VERLYN David NFA:[id number] DOB: 1925/10/23 DOA: 06/25/2012 PCP: Tillman Abide, MD  Assessment/Plan: 1. Acute respiratory failure: Secondary to pneumonia. Resolved. 2. CAP: Improving. No oxygen requirement. Complete treatment as an outpatient. 3. Nonsustained atrial tachycardia: Asymptomatic. Pt with nonsustained atrial tachycardia, PACs, and possibly afib (not well documented) in the setting of pneumonia. As atrial fibrillation is not well documented cardiology recommended no warfarin at this point. Event monitor was recommended but the patient has declined this. Follow up with primary care physician. Followup with cardiology as an outpatient. 4. COPD: Stable.  5. Dehydration: Resolved. Secondary to acute illness, Maxzide. 6. HTN: Stable. 7. History of PE 8. Former smoker  Code Status: DNR Family Communication: Discussed with wife at bedside. Disposition Plan: Home today. Patient declines home health physical therapy.  Brendia Sacks, MD  [facility] Pager [phone]. If 7PM-7AM, please contact night-coverage at [email or web address], password [facility] 06/28/2012, 8:35 AM  LOS: 3 days   Brief narrative: 78 year old man presented with shortness of breath, cough and hypoxia. Admitted for treatment for pneumonia.  Consultants:  Cardiology  ST: Regular diet, thin liquids, medications whole with liquid  Procedures:  2-D echocardiogram: LVEF 55%, grade 1 diastolic dysfunction.  Antibiotics:  Azithromycin 7/23>>7/24  Ceftriaxone 7/23>>7/24  Levaquin 7/25>>7/30  HPI/Subjective: Afebrile, VSS. No hypoxia. No complaints. Ready to go home.  Objective: Filed Vitals:   06/27/12 2011 06/27/12 2100 06/28/12 0154 06/28/12 0500  BP:  135/62  136/72  Pulse: 72 74 77 84  Temp:  98.8 F (37.1 C)  98.3 F (36.8 C)  TempSrc:      Resp: 30 22 24 20   Height:      Weight:    79.652 kg (175 lb 9.6 oz)  SpO2: 94% 91% 94% 98%    Intake/Output Summary (Last  24 hours) at 06/28/12 0835 Last data filed at 06/28/12 0500  Gross per 24 hour  Intake      0 ml  Output    850 ml  Net   -850 ml    Exam:   General:  Appears calm and comfortable. Speech fluent and clear.  Cardiovascular: Regular rate and rhythm. No murmur, rub, gallop. No lower extremity edema.  Telemetry: Sinus rhythm, PACs.  Respiratory: Generally clear to auscultation bilaterally except for some wheezes. No rales. Few rhonchi. Normal respiratory effort.  Data Reviewed: Basic Metabolic Panel:  Lab 06/28/12 2841 06/26/12 1134 06/26/12 0324 06/25/12 2141 06/25/12 1652  NA 140 -- 138 -- 135  K 4.4 2.9* 2.8* -- 3.3*  CL 102 -- 100 -- 94*  CO2 24 -- 27 -- 30  GLUCOSE 116* -- 137* -- 126*  BUN 14 -- 20 -- 22  CREATININE 0.79 -- 0.96 0.97 1.02  CALCIUM 9.0 -- 8.3* -- 9.2  MG -- 2.2 -- -- --  PHOS -- -- -- -- --   Liver Function Tests:  Lab 06/26/12 0324  AST 30  ALT 20  ALKPHOS 79  BILITOT 0.9  PROT 6.0  ALBUMIN 2.3*   CBC:  Lab 06/28/12 0500 06/25/12 2141 06/25/12 1652  WBC 16.7* 18.5* 19.9*  NEUTROABS -- -- 17.1*  HGB 13.2 13.5 13.7  HCT 40.0 40.7 40.8  MCV 85.5 85.7 84.6  PLT 259 183 186   Cardiac Enzymes:  Lab 06/25/12 2141 06/25/12 1652  CKTOTAL 270* --  CKMB 5.6* --  CKMBINDEX -- --  TROPONINI <0.30 <0.30   CBG:  Lab 06/26/12 0717  GLUCAP 123*    Recent Results (from  the past 240 hour(s))  MRSA PCR SCREENING     Status: Normal   Collection Time   06/26/12 12:25 AM      Component Value Range Status Comment   MRSA by PCR NEGATIVE  NEGATIVE Final   URINE CULTURE     Status: Normal   Collection Time   06/26/12 10:29 AM      Component Value Range Status Comment   Specimen Description URINE, CLEAN CATCH   Final    Special Requests NONE   Final    Culture  Setup Time 06/26/2012 11:18   Final    Colony Count NO GROWTH   Final    Culture NO GROWTH   Final    Report Status 06/27/2012 FINAL   Final     Studies: Dg Chest 2 View  06/25/2012   *RADIOLOGY REPORT*  Clinical Data: Short of breath  CHEST - 2 VIEW  Comparison: 11/08/2010  Findings: COPD with hyperinflation.  Left lower lobe infiltrate, suspicious for pneumonia.  This was not present previously.  Right lung is clear.  Negative for heart failure or effusion.  IMPRESSION: COPD with left lower lobe infiltrate, consistent with pneumonia.  Original Report Authenticated By: Camelia Phenes, M.D.   Scheduled Meds:   . albuterol  2.5 mg Nebulization BID  . aspirin EC  162 mg Oral Daily  . azithromycin  500 mg Intravenous Q24H  . cefTRIAXone (ROCEPHIN)  IV  1 g Intravenous Q24H  . cyanocobalamin  1,000 mcg Subcutaneous Daily  . enoxaparin (LOVENOX) injection  40 mg Subcutaneous Q24H  . ipratropium  0.5 mg Nebulization BID  . pantoprazole  40 mg Oral Daily  . potassium chloride  40 mEq Oral BID  . potassium chloride  40 mEq Oral Once  . DISCONTD: albuterol  2.5 mg Nebulization Q6H  . DISCONTD: ipratropium  0.5 mg Nebulization Q6H  . DISCONTD: sodium chloride  3 mL Intravenous Q12H   Continuous Infusions:   Principal Problem:  *CAP (community acquired pneumonia)- left lower lobe Active Problems:  Acute respiratory failure with hypoxia  Atrial tachycardia  HYPERTENSION  COPD (chronic obstructive pulmonary disease)  Premature atrial complexes     Brendia Sacks, MD  [facility] Pager [phone]. If 7PM-7AM, please contact night-coverage at [email or web address], password [facility] 06/28/2012, 8:35 AM  LOS: 3 days

## 2012-06-28 NOTE — Discharge Summary (Addendum)
Physician Discharge Summary  Bryan David WUJ:[id number] DOB: 1925/02/02 DOA: 06/25/2012  PCP: Tillman Abide, MD  Admit date: 06/25/2012 Discharge date: 06/28/2012  Recommendations for Outpatient Follow-up:  1. Followup resolution of pneumonia. 2. Consider outpatient event monitor, patient currently declines this. 3. Patient refused home physical therapy.  Follow-up Information    Follow up with Tillman Abide, MD. Schedule an appointment as soon as possible for a visit in 1 week.   Contact information:   [address] [place] [address] [phone]       Follow up with Hillis Range, MD. Schedule an appointment as soon as possible for a visit in 3 weeks.   Contact information:   [address] [place] [address] [phone]         Discharge Diagnoses:  1. Acute respiratory failure secondary to pneumonia 2. Community acquired pneumonia 3. Nonsustained atrial tachycardia 4. Dehydration 5. COPD, stable  Discharge Condition: Improved Disposition: Home  Diet recommendation: Heart healthy  History of present illness:  78 year old man presented with shortness of breath, cough and hypoxia. Admitted for treatment for pneumonia.  Hospital Course:  Mr. Gasbarro was admitted for treatment for pneumonia. His condition rapidly improved and he will be discharged home on oral antibiotics. He has no oxygen requirement at this point. He had an episode of nonsustained atrial tachycardia of unclear etiology and significance. Outpatient event monitor was recommended the patient declined this at this point. No clear indication for warfarin at this point. Dehydration resolved with treatment of acute illness. These and other issues as listed below. 1. Acute respiratory failure: Secondary to pneumonia. Resolved.  2. CAP: Improving. No oxygen requirement. Complete treatment as an outpatient.  3. Nonsustained atrial tachycardia: Asymptomatic. Pt with  nonsustained atrial tachycardia, PACs, and possibly afib (not well documented) in the setting of pneumonia. As atrial fibrillation is not well documented cardiology recommended no warfarin at this point. Event monitor was recommended but the patient has declined this. Follow up with primary care physician. Followup with cardiology as an outpatient.  4. COPD: Stable.    5. Dehydration: Resolved. Secondary to acute illness, Maxzide.  6. HTN: Stable.  7. History of PE  8. Former smoker  Consultants:  Cardiology   ST: Regular diet, thin liquids, medications whole with liquid  Procedures:  2-D echocardiogram: LVEF 55%, grade 1 diastolic dysfunction.  Antibiotics:  Azithromycin 7/23>>7/24   Ceftriaxone 7/23>>7/24   Levaquin 7/25>>7/30  Discharge Instructions  Discharge Orders    Future Appointments: Provider: Department: Dept Phone: Center:   10/24/2012 9:00 AM Karie Schwalbe, MD [facility] [phone] [id number]     Future Orders Please Complete By Expires   Diet - low sodium heart healthy      Increase activity slowly      Discharge instructions      Comments:   --Be sure to finish antibiotic for pneumonia.     Medication List  As of 06/28/2012 12:24 PM   TAKE these medications         albuterol 108 (90 BASE) MCG/ACT inhaler   Commonly known as: PROVENTIL HFA;VENTOLIN HFA   Inhale 2 puffs into the lungs every 6 (six) hours as needed for wheezing or shortness of breath.      aspirin EC 81 MG tablet   Take 162 mg by mouth daily.      esomeprazole 40 MG capsule   Commonly known as: NEXIUM   Take 40 mg by mouth daily  before breakfast. Take 1 capsule before breakfast as needed.      ibuprofen 200 MG tablet   Commonly known as: ADVIL,MOTRIN   Take 200 mg by mouth daily as needed. For pain      levofloxacin 750 MG tablet   Commonly known as: LEVAQUIN   Take 1 tablet (750 mg total) by mouth daily. Start 7/26 at lunchtime.      triamterene-hydrochlorothiazide  37.5-25 MG per tablet   Commonly known as: MAXZIDE-25   Take 1 each (1 tablet total) by mouth daily.           The results of significant diagnostics from this hospitalization (including imaging, microbiology, ancillary and laboratory) are listed below for reference.    Significant Diagnostic Studies: Dg Chest 2 View 06/25/2012  *RADIOLOGY REPORT*  Clinical Data: Short of breath  CHEST - 2 VIEW  Comparison: 11/08/2010  Findings: COPD with hyperinflation.  Left lower lobe infiltrate, suspicious for pneumonia.  This was not present previously.  Right lung is clear.  Negative for heart failure or effusion.  IMPRESSION: COPD with left lower lobe infiltrate, consistent with pneumonia.  Original Report Authenticated By: Camelia Phenes, M.D.   Microbiology: Recent Results (from the past 240 hour(s))  MRSA PCR SCREENING     Status: Normal   Collection Time   06/26/12 12:25 AM      Component Value Range Status Comment   MRSA by PCR NEGATIVE  NEGATIVE Final   URINE CULTURE     Status: Normal   Collection Time   06/26/12 10:29 AM      Component Value Range Status Comment   Specimen Description URINE, CLEAN CATCH   Final    Special Requests NONE   Final    Culture  Setup Time 06/26/2012 11:18   Final    Colony Count NO GROWTH   Final    Culture NO GROWTH   Final    Report Status 06/27/2012 FINAL   Final     Labs: Basic Metabolic Panel:  Lab 06/28/12 1914 06/26/12 1134 06/26/12 0324 06/25/12 2141 06/25/12 1652  NA 140 -- 138 -- 135  K 4.4 2.9* 2.8* -- 3.3*  CL 102 -- 100 -- 94*  CO2 24 -- 27 -- 30  GLUCOSE 116* -- 137* -- 126*  BUN 14 -- 20 -- 22  CREATININE 0.79 -- 0.96 0.97 1.02  CALCIUM 9.0 -- 8.3* -- 9.2  MG -- 2.2 -- -- --  PHOS -- -- -- -- --   Liver Function Tests:  Lab 06/26/12 0324  AST 30  ALT 20  ALKPHOS 79  BILITOT 0.9  PROT 6.0  ALBUMIN 2.3*   CBC:  Lab 06/28/12 0500 06/25/12 2141 06/25/12 1652  WBC 16.7* 18.5* 19.9*  NEUTROABS -- -- 17.1*  HGB 13.2 13.5  13.7  HCT 40.0 40.7 40.8  MCV 85.5 85.7 84.6  PLT 259 183 186   Cardiac Enzymes:  Lab 06/25/12 2141 06/25/12 1652  CKTOTAL 270* --  CKMB 5.6* --  CKMBINDEX -- --  TROPONINI <0.30 <0.30   CBG:  Lab 06/26/12 0717  GLUCAP 123*    Principal Problem:  *CAP (community acquired pneumonia)- left lower lobe Active Problems:  Acute respiratory failure with hypoxia  Atrial tachycardia  HYPERTENSION  COPD (chronic obstructive pulmonary disease)  Premature atrial complexes   Time coordinating discharge: 35 minutes.  Signed:  Brendia Sacks, MD [facility] 06/28/2012, 12:24 PM

## 2012-06-28 NOTE — Progress Notes (Signed)
   CARDIOLOGY ROUNDING NOTE    Patient Name: Bryan David Date of Encounter: 06-28-2012    SUBJECTIVE:Patient feels well.  No chest pain or palpitations.  Shortness of breath much improved  TELEMETRY: Reviewed telemetry pt in sinus rhythm/sinus tach Filed Vitals:   06/27/12 2011 06/27/12 2100 06/28/12 0154 06/28/12 0500  BP:  135/62  136/72  Pulse: 72 74 77 84  Temp:  98.8 F (37.1 C)  98.3 F (36.8 C)  TempSrc:      Resp: 30 22 24 20   Height:      Weight:    175 lb 9.6 oz (79.652 kg)  SpO2: 94% 91% 94% 98%    Intake/Output Summary (Last 24 hours) at 06/28/12 0805 Last data filed at 06/28/12 0500  Gross per 24 hour  Intake      0 ml  Output    850 ml  Net   -850 ml    LABS: Basic Metabolic Panel:  Basename 06/28/12 0500 06/26/12 1134 06/26/12 0324  NA 140 -- 138  K 4.4 2.9* --  CL 102 -- 100  CO2 24 -- 27  GLUCOSE 116* -- 137*  BUN 14 -- 20  CREATININE 0.79 -- 0.96  CALCIUM 9.0 -- 8.3*  MG -- 2.2 --  PHOS -- -- --   Liver Function Tests:  [facility] 06/26/12 0324  AST 30  ALT 20  ALKPHOS 79  BILITOT 0.9  PROT 6.0  ALBUMIN 2.3*   CBC:  Basename 06/28/12 0500 06/25/12 2141 06/25/12 1652  WBC 16.7* 18.5* --  NEUTROABS -- -- 17.1*  HGB 13.2 13.5 --  HCT 40.0 40.7 --  MCV 85.5 85.7 --  PLT 259 183 --   ECHO: 06-27-2012- EF 55%, grade 1 diastolic dysfunction, mild AS, LA 44  Physical Exam: Filed Vitals:   06/27/12 2100 06/28/12 0154 06/28/12 0500 06/28/12 0946  BP: 135/62  136/72   Pulse: 74 77 84   Temp: 98.8 F (37.1 C)  98.3 F (36.8 C)   TempSrc:      Resp: 22 24 20    Height:      Weight:   175 lb 9.6 oz (79.652 kg)   SpO2: 91% 94% 98% 97%    GEN- The patient is well appearing, alert and oriented x 3 today.   Head- normocephalic, atraumatic Eyes-  Sclera clear, conjunctiva pink Ears- hearing intact Oropharynx- clear Neck- supple, no JVP Lymph- no cervical lymphadenopathy Lungs- Clear to ausculation bilaterally, normal work of  breathing Heart- Regular rate and rhythm, no murmurs, rubs or gallops, PMI not laterally displaced GI- soft, NT, ND, + BS Extremities- no clubbing, cyanosis, or edema   Assessment and Plan:  1. Nonsustained atrial tachycardia- asymptomatic As afib is not well documented, I would not initiate coumadin at this point.  I have recommended that he wear an event monitor but he declines.   He wishes to go home and "see how he does" and follow up with his primary care physician as an outpatient.  I will sign off Please call if I can assist further.

## 2012-06-28 NOTE — Progress Notes (Signed)
Pt and pt wife educated on d/c instructions and education. Pt and pt wife verbalized understanding. Pt and pt wife refuse PT/OT screenings. Pt educated on new medications (how/when) to take them. All questions answered to pt satisfaction. No needs at this time. IV removed with tip intact. Heart monitor cleaned and returned to front. Will continue to monitor until patient leaves floor. Ramond Craver, RN

## 2012-06-28 NOTE — Progress Notes (Signed)
PT Cancellation Note  Treatment cancelled today due to patient's refusal to participate. Pt is currently discharging. Discussed d/c with pt and wife, no concerns or questions.   Milana Kidney 06/28/2012, 10:23 AM

## 2012-07-05 ENCOUNTER — Encounter: Payer: Self-pay | Admitting: Internal Medicine

## 2012-07-05 ENCOUNTER — Ambulatory Visit (INDEPENDENT_AMBULATORY_CARE_PROVIDER_SITE_OTHER): Payer: Medicare Other | Admitting: Internal Medicine

## 2012-07-05 VITALS — BP 118/64 | HR 86 | Temp 98.0°F | Ht 68.0 in | Wt 159.5 lb

## 2012-07-05 DIAGNOSIS — J189 Pneumonia, unspecified organism: Secondary | ICD-10-CM

## 2012-07-05 DIAGNOSIS — I491 Atrial premature depolarization: Secondary | ICD-10-CM

## 2012-07-05 NOTE — Patient Instructions (Signed)
Please schedule a CXR here in about 3 weeks to follow up on pneumonia  Set up appt with Dr Mariah Milling or Kirke Corin to evaluate the skipped beats and the possible junctional heart rhythm

## 2012-07-05 NOTE — Progress Notes (Signed)
Subjective:    Patient ID: Bryan David, male    DOB: 1925-10-11, 78 y.o.   MRN: [id number]  HPI Hospitalized last week for 4 days for pneumonia Home for a week Feels better No cough except rarely---will get scant phlegm No SOB---feels back to normal No fever Finished with antibiotic Has used the albuterol sporadically--last was yesterday AM  Doesn't have any heart symptoms No palpitations No chest pain No dizziness or syncope  Current Outpatient Prescriptions on File Prior to Visit  Medication Sig Dispense Refill  . albuterol (PROVENTIL HFA;VENTOLIN HFA) 108 (90 BASE) MCG/ACT inhaler Inhale 2 puffs into the lungs every 6 (six) hours as needed for wheezing or shortness of breath.  1 Inhaler  0  . aspirin EC 81 MG tablet Take 162 mg by mouth daily.      Marland Kitchen ibuprofen (ADVIL,MOTRIN) 200 MG tablet Take 200 mg by mouth daily as needed. For pain      . triamterene-hydrochlorothiazide (MAXZIDE-25) 37.5-25 MG per tablet Take 1 each (1 tablet total) by mouth daily.  90 tablet  3  . esomeprazole (NEXIUM) 40 MG capsule Take 40 mg by mouth daily before breakfast. Take 1 capsule before breakfast as needed.      Marland Kitchen levofloxacin (LEVAQUIN) 750 MG tablet Take 1 tablet (750 mg total) by mouth daily. Start 7/26 at lunchtime.  5 tablet  0    Allergies  Allergen Reactions  . Acetaminophen Other (See Comments)    "made him feel silly"    Past Medical History  Diagnosis Date  . Bladder cancer   . Renal cancer   . Pulmonary embolus   . GERD (gastroesophageal reflux disease)   . Arthritis   . COPD (chronic obstructive pulmonary disease)   . Hypertension   . Urinary frequency   . Carpal tunnel syndrome   . Vitamin B12 deficiency   . Hiatal hernia   . Esophageal stricture   . Osteoarthritis   . Diverticulosis   . PE (pulmonary embolism)   . Choledocholithiasis   . Biliary acute pancreatitis   . Anxiety   . Shortness of breath   . Pneumonia     Past Surgical History  Procedure Date  .  Inguinal hernia repair 1998    right  . Cholecystectomy   . Partial nephrectomy   . Esophagogastroduodenoscopy   . Carpal tunnel release 12/11    Right---Dr Mina Marble  . Transurethral resection of prostate     limited  . Cystostomy w/ bladder biopsy     multiple    Family History  Problem Relation Age of Onset  . Hypertension Mother   . Colon cancer Neg Hx   . Heart failure Mother   . Stroke Mother   . Stroke Brother   . Heart attack Brother   . Diabetes Sister   . Stroke Sister     History   Social History  . Marital Status: Married    Spouse Name: N/A    Number of Children: 2  . Years of Education: N/A   Occupational History  . retired - Land, still does some produce    Social History Main Topics  . Smoking status: Former Smoker -- 1.0 packs/day for 40 years    Types: Cigarettes    Quit date: 06/25/1981  . Smokeless tobacco: Never Used  . Alcohol Use: No  . Drug Use: No  . Sexually Active: Not on file   Other Topics Concern  . Not on file  Social History Narrative   Enjoys rabbit hunting..No living will..Asks for wife as health care POA..Willing to try resuscitation but no prolonged machines..Not sure about feeding tube   Review of Systems Appetite was poor when sick--improved now Weight is down slightly     Objective:   Physical Exam  Constitutional: He appears well-developed and well-nourished. No distress.  Neck: Normal range of motion. Neck supple.  Cardiovascular: Normal rate and normal heart sounds.  Exam reveals no gallop.   No murmur heard.      Sounds regular but with frequent extra beats (makes certainty about rhythm difficult)  Pulmonary/Chest: Effort normal and breath sounds normal. No respiratory distress. He has no wheezes. He has no rales.       No dullness  Musculoskeletal: He exhibits no edema and no tenderness.  Lymphadenopathy:    He has no cervical adenopathy.          Assessment & Plan:

## 2012-07-05 NOTE — Assessment & Plan Note (Signed)
He has frequent PACs but I am concerned that his EKG shows a junctional rhythm (very short PR) ?delta wave He has no symptoms Will go ahead with cardiology evaluation just in case

## 2012-07-05 NOTE — Assessment & Plan Note (Signed)
Seems to have responded to Rx Feels better now Done with antibiotics Will check CXR in about 3 weeks

## 2012-07-09 ENCOUNTER — Ambulatory Visit: Payer: Medicare Other | Admitting: Cardiovascular Disease

## 2012-07-09 ENCOUNTER — Encounter: Payer: Self-pay | Admitting: Cardiovascular Disease

## 2012-07-09 ENCOUNTER — Ambulatory Visit (INDEPENDENT_AMBULATORY_CARE_PROVIDER_SITE_OTHER): Payer: Medicare Other | Admitting: Cardiovascular Disease

## 2012-07-09 VITALS — BP 125/80 | HR 84 | Ht 72.0 in | Wt 157.5 lb

## 2012-07-09 DIAGNOSIS — I10 Essential (primary) hypertension: Secondary | ICD-10-CM

## 2012-07-09 DIAGNOSIS — R9431 Abnormal electrocardiogram [ECG] [EKG]: Secondary | ICD-10-CM

## 2012-07-09 DIAGNOSIS — R5383 Other fatigue: Secondary | ICD-10-CM

## 2012-07-09 DIAGNOSIS — J189 Pneumonia, unspecified organism: Secondary | ICD-10-CM

## 2012-07-09 DIAGNOSIS — R531 Weakness: Secondary | ICD-10-CM

## 2012-07-09 NOTE — Progress Notes (Signed)
   Patient ID: Bryan David, male    DOB: 1925-02-15, 78 y.o.   MRN: [id number]  HPI Comments: Is a pleasant 78 year old gentleman with a history of smoking for 41 years, COPD arthritis, who rises at 4 in the morning almost on a daily basis to work in his garden, who presents by referral evaluation of abnormal EKG.  He is recently getting over a pneumonia. He reports that he is back to his baseline. He denies any symptoms of lightheadedness dizziness, shortness of breath. Overall he feels well. He wonders why he is here. He does not like taking medications. He does have occasional weakness in his legs. His weight has been slowly going down despite his good appetite. He denies any tachycardia or palpitations.  EKG today shows normal sinus rhythm with short PR interval, rare APC, unable to exclude delta waves. Previous EKG is essentially the same with short PR interval, normal sinus rhythm.   Outpatient Encounter Prescriptions as of 07/09/2012  Medication Sig Dispense Refill  . aspirin EC 81 MG tablet Take 162 mg by mouth daily.      Marland Kitchen esomeprazole (NEXIUM) 40 MG capsule Take 40 mg by mouth daily before breakfast. Take 1 capsule before breakfast as needed.      Marland Kitchen ibuprofen (ADVIL,MOTRIN) 200 MG tablet Take 200 mg by mouth daily as needed. For pain      . triamterene-hydrochlorothiazide (MAXZIDE-25) 37.5-25 MG per tablet Take 1 each (1 tablet total) by mouth daily.  90 tablet  3  . DISCONTD: albuterol (PROVENTIL HFA;VENTOLIN HFA) 108 (90 BASE) MCG/ACT inhaler Inhale 2 puffs into the lungs every 6 (six) hours as needed for wheezing or shortness of breath.  1 Inhaler  0    Review of Systems  HENT: Negative.   Eyes: Negative.   Respiratory: Negative.   Cardiovascular: Negative.   Gastrointestinal: Negative.   Musculoskeletal: Negative.   Skin: Negative.   Neurological: Positive for weakness.  Hematological: Negative.   Psychiatric/Behavioral: Negative.   All other systems reviewed and are  negative.    BP 125/80  Pulse 84  Ht 6' (1.829 m)  Wt 157 lb 8 oz (71.442 kg)  BMI 21.36 kg/m2  Physical Exam  Nursing note and vitals reviewed. Constitutional: He is oriented to person, place, and time.       Thin  HENT:  Head: Normocephalic.  Nose: Nose normal.  Mouth/Throat: Oropharynx is clear and moist.  Eyes: Conjunctivae are normal. Pupils are equal, round, and reactive to light.  Neck: Normal range of motion. Neck supple. No JVD present.  Cardiovascular: Normal rate, regular rhythm, S1 normal, S2 normal, normal heart sounds and intact distal pulses.  Exam reveals no gallop and no friction rub.   No murmur heard. Pulmonary/Chest: Effort normal. No respiratory distress. He has decreased breath sounds. He has no wheezes. He has no rales. He exhibits no tenderness.  Abdominal: Soft. Bowel sounds are normal. He exhibits no distension. There is no tenderness.  Musculoskeletal: Normal range of motion. He exhibits no edema and no tenderness.  Lymphadenopathy:    He has no cervical adenopathy.  Neurological: He is alert and oriented to person, place, and time. Coordination normal.  Skin: Skin is warm and dry. No rash noted. No erythema.  Psychiatric: He has a normal mood and affect. His behavior is normal. Judgment and thought content normal.           Assessment and Plan

## 2012-07-09 NOTE — Assessment & Plan Note (Addendum)
Normal sinus rhythm with short PR interval. We will have [place] [facility] evaluate his EKG to exclude delta waves. Uncertain if we would act upon this finding if confirmed. He does not want any further workup as he feels well and has no symptoms. Would likely  monitor for now.

## 2012-07-09 NOTE — Patient Instructions (Addendum)
You are doing well. No medication changes were made.  Please call us if you have new issues that need to be addressed before your next appt.    

## 2012-07-09 NOTE — Assessment & Plan Note (Signed)
Blood pressure is well controlled on today's visit. No changes made to the medications. 

## 2012-07-09 NOTE — Assessment & Plan Note (Signed)
He appears to be improving after his recent pneumonia. He feels back to his baseline.

## 2012-07-26 ENCOUNTER — Other Ambulatory Visit: Payer: Self-pay | Admitting: Internal Medicine

## 2012-07-26 ENCOUNTER — Ambulatory Visit (INDEPENDENT_AMBULATORY_CARE_PROVIDER_SITE_OTHER)
Admission: RE | Admit: 2012-07-26 | Discharge: 2012-07-26 | Disposition: A | Discharge status: Home / Self Care | Payer: Medicare Other | Source: Ambulatory Visit | Attending: Internal Medicine | Admitting: Internal Medicine

## 2012-07-26 DIAGNOSIS — J189 Pneumonia, unspecified organism: Secondary | ICD-10-CM

## 2012-08-10 ENCOUNTER — Other Ambulatory Visit: Payer: Self-pay

## 2012-08-10 ENCOUNTER — Telehealth: Payer: Self-pay

## 2012-08-10 MED ORDER — METOPROLOL TARTRATE 25 MG PO TABS: 12.5000 mg | ORAL_TABLET | Freq: Two times a day (BID) | ORAL | Status: DC

## 2012-08-10 NOTE — Telephone Encounter (Signed)
pts wife informed.  Understanding verb. She also asks that I mail her info on this condition She will call me back should pt want to be seen to discuss further RX sent to pharm.

## 2012-08-10 NOTE — Telephone Encounter (Signed)
Message copied by Marcelle Overlie on Fri Aug 10, 2012  8:31 AM ------      Message from: Antonieta Iba      Created: Fri Aug 10, 2012  8:18 AM       Sorry for the delay,       I have finally been able to pin down Dr. Sherryl Manges to evaluate this gentlemen's EKG.      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      He was not very concerned about him needing additional workup.      I mentioned B-blockers and he though this might be a good idea.      If he were ever to develop a rapid atrial arrhythmia, there is always a chance the accessory pathway could conduct the ventricle to a very rapid rate.             I will have my nurse call and suggest he start metoprolol tartrate 12.5 mg BID with slow titration possibly to 25 BID.            Thx      Tim

## 2012-10-24 ENCOUNTER — Ambulatory Visit (INDEPENDENT_AMBULATORY_CARE_PROVIDER_SITE_OTHER): Payer: Medicare Other | Admitting: Internal Medicine

## 2012-10-24 ENCOUNTER — Encounter: Payer: Self-pay | Admitting: Internal Medicine

## 2012-10-24 VITALS — BP 130/70 | HR 60 | Temp 98.4°F | Wt 170.0 lb

## 2012-10-24 DIAGNOSIS — I498 Other specified cardiac arrhythmias: Secondary | ICD-10-CM

## 2012-10-24 DIAGNOSIS — M159 Polyosteoarthritis, unspecified: Secondary | ICD-10-CM

## 2012-10-24 DIAGNOSIS — J449 Chronic obstructive pulmonary disease, unspecified: Secondary | ICD-10-CM

## 2012-10-24 DIAGNOSIS — I471 Supraventricular tachycardia: Secondary | ICD-10-CM

## 2012-10-24 DIAGNOSIS — I10 Essential (primary) hypertension: Secondary | ICD-10-CM

## 2012-10-24 DIAGNOSIS — K219 Gastro-esophageal reflux disease without esophagitis: Secondary | ICD-10-CM

## 2012-10-24 MED ORDER — ESOMEPRAZOLE MAGNESIUM 40 MG PO CPDR: 40.0000 mg | DELAYED_RELEASE_CAPSULE | Freq: Every day | ORAL | Status: DC

## 2012-10-24 NOTE — Assessment & Plan Note (Signed)
BP Readings from Last 3 Encounters:  10/24/12 130/70  07/09/12 125/80  07/05/12 118/64   Good control No changes needed

## 2012-10-24 NOTE — Assessment & Plan Note (Signed)
Probably needs more regular nexium---only 1 per week lately Past strictures Will renew Needs at least every other day probably

## 2012-10-24 NOTE — Assessment & Plan Note (Signed)
When he was sick in the hospital No signs of recurrence

## 2012-10-24 NOTE — Assessment & Plan Note (Signed)
Back and hands mostly Uses his aspirin and rarely the ibuprofen Some limitations but remains active

## 2012-10-24 NOTE — Progress Notes (Signed)
Subjective:    Patient ID: Bryan David, male    DOB: 06-14-1925, 78 y.o.   MRN: [id number]  HPI Here with wife Feels well Did raise a garden, some other work around the house  No cough or breathing problems No chest pain No palpitations No dizziness or syncope No edema  No stomach problems Has cut back on nexium-- takes it prn Does note problems if he is bending over in the garden Swallowing is fine  Current Outpatient Prescriptions on File Prior to Visit  Medication Sig Dispense Refill  . aspirin EC 81 MG tablet Take 162 mg by mouth daily.      Marland Kitchen esomeprazole (NEXIUM) 40 MG capsule Take 40 mg by mouth daily before breakfast. Take 1 capsule before breakfast as needed.      Marland Kitchen ibuprofen (ADVIL,MOTRIN) 200 MG tablet Take 200 mg by mouth daily as needed. For pain      . metoprolol tartrate (LOPRESSOR) 25 MG tablet Take 0.5 tablets (12.5 mg total) by mouth 2 (two) times daily.  180 tablet  3  . triamterene-hydrochlorothiazide (MAXZIDE-25) 37.5-25 MG per tablet Take 1 each (1 tablet total) by mouth daily.  90 tablet  3    Allergies  Allergen Reactions  . Acetaminophen Other (See Comments)    "made him feel silly"    Past Medical History  Diagnosis Date  . Bladder cancer   . Renal cancer   . Pulmonary embolus   . GERD (gastroesophageal reflux disease)   . Arthritis   . COPD (chronic obstructive pulmonary disease)   . Hypertension   . Urinary frequency   . Carpal tunnel syndrome   . Vitamin B12 deficiency   . Hiatal hernia   . Esophageal stricture   . Osteoarthritis   . Diverticulosis   . PE (pulmonary embolism)   . Choledocholithiasis   . Biliary acute pancreatitis   . Anxiety   . Shortness of breath   . Pneumonia     Past Surgical History  Procedure Date  . Inguinal hernia repair 1998    right  . Cholecystectomy   . Partial nephrectomy   . Esophagogastroduodenoscopy   . Carpal tunnel release 12/11    Right---Dr Mina Marble  . Transurethral resection of  prostate     limited  . Cystostomy w/ bladder biopsy     multiple    Family History  Problem Relation Age of Onset  . Hypertension Mother   . Colon cancer Neg Hx   . Heart failure Mother   . Stroke Mother   . Stroke Brother   . Heart attack Brother   . Diabetes Sister   . Stroke Sister     History   Social History  . Marital Status: Married    Spouse Name: N/A    Number of Children: 2  . Years of Education: N/A   Occupational History  . retired - [employer], still does some produce    Social History Main Topics  . Smoking status: Former Smoker -- 1.0 packs/day for 40 years    Types: Cigarettes    Quit date: 06/25/1981  . Smokeless tobacco: Never Used  . Alcohol Use: No  . Drug Use: No  . Sexually Active: Not on file   Other Topics Concern  . Not on file   Social History Narrative   Enjoys rabbit hunting..No living will..Asks for wife as health care POA..Willing to try resuscitation but no prolonged machines..Not sure about feeding tube   Review  of Systems Had diarrhea for 2 months--blames the nexium. This is better now Eating well again Weight back up to normal for now    Objective:   Physical Exam  Constitutional: He appears well-developed and well-nourished. No distress.  Neck: No thyromegaly present.  Cardiovascular: Normal rate, regular rhythm and normal heart sounds.  Exam reveals no gallop.   No murmur heard. Pulmonary/Chest: Effort normal and breath sounds normal. No respiratory distress. He has no wheezes. He has no rales.  Abdominal: Soft. There is no tenderness.  Musculoskeletal: He exhibits no edema and no tenderness.  Lymphadenopathy:    He has no cervical adenopathy.  Psychiatric: He has a normal mood and affect. His behavior is normal.          Assessment & Plan:

## 2012-10-24 NOTE — Assessment & Plan Note (Signed)
Decreased breath sounds but clear No rx needed

## 2013-01-24 ENCOUNTER — Ambulatory Visit (INDEPENDENT_AMBULATORY_CARE_PROVIDER_SITE_OTHER): Payer: Medicare Other | Admitting: Ophthalmology

## 2013-01-24 DIAGNOSIS — H35379 Puckering of macula, unspecified eye: Secondary | ICD-10-CM

## 2013-01-24 DIAGNOSIS — H35039 Hypertensive retinopathy, unspecified eye: Secondary | ICD-10-CM

## 2013-01-24 DIAGNOSIS — I10 Essential (primary) hypertension: Secondary | ICD-10-CM

## 2013-01-24 DIAGNOSIS — H33309 Unspecified retinal break, unspecified eye: Secondary | ICD-10-CM

## 2013-01-24 DIAGNOSIS — H2510 Age-related nuclear cataract, unspecified eye: Secondary | ICD-10-CM

## 2013-01-24 DIAGNOSIS — H43819 Vitreous degeneration, unspecified eye: Secondary | ICD-10-CM

## 2013-01-24 DIAGNOSIS — H3530 Unspecified macular degeneration: Secondary | ICD-10-CM

## 2013-02-25 ENCOUNTER — Other Ambulatory Visit: Payer: Self-pay | Admitting: Internal Medicine

## 2013-04-24 ENCOUNTER — Ambulatory Visit (INDEPENDENT_AMBULATORY_CARE_PROVIDER_SITE_OTHER): Payer: Medicare Other | Admitting: Internal Medicine

## 2013-04-24 ENCOUNTER — Encounter: Payer: Self-pay | Admitting: Internal Medicine

## 2013-04-24 VITALS — BP 128/68 | HR 62 | Temp 97.7°F | Wt 171.0 lb

## 2013-04-24 DIAGNOSIS — M159 Polyosteoarthritis, unspecified: Secondary | ICD-10-CM

## 2013-04-24 DIAGNOSIS — I10 Essential (primary) hypertension: Secondary | ICD-10-CM

## 2013-04-24 DIAGNOSIS — K219 Gastro-esophageal reflux disease without esophagitis: Secondary | ICD-10-CM

## 2013-04-24 LAB — CBC WITH DIFFERENTIAL/PLATELET
Basophils Relative: 0.9 % (ref 0.0–3.0)
Eosinophils Relative: 2.8 % (ref 0.0–5.0)
HCT: 45.2 % (ref 39.0–52.0)
Lymphs Abs: 1.4 10*3/uL (ref 0.7–4.0)
Monocytes Relative: 9.7 % (ref 3.0–12.0)
Neutrophils Relative %: 68.7 % (ref 43.0–77.0)
Platelets: 161 10*3/uL (ref 150.0–400.0)
RBC: 5.27 Mil/uL (ref 4.22–5.81)
WBC: 7.6 10*3/uL (ref 4.5–10.5)

## 2013-04-24 LAB — HEPATIC FUNCTION PANEL
AST: 20 U/L (ref 0–37)
Alkaline Phosphatase: 51 U/L (ref 39–117)
Total Bilirubin: 0.9 mg/dL (ref 0.3–1.2)

## 2013-04-24 LAB — BASIC METABOLIC PANEL
BUN: 14 mg/dL (ref 6–23)
GFR: 62.9 mL/min (ref >60.00)
Potassium: 4.3 mEq/L (ref 3.5–5.1)

## 2013-04-24 LAB — TSH: TSH: 0.86 u[IU]/mL (ref 0.35–5.50)

## 2013-04-24 NOTE — Assessment & Plan Note (Signed)
Mild Has ibuprofen for prn

## 2013-04-24 NOTE — Assessment & Plan Note (Signed)
Ongoing symptoms now that he is bending a lot in garden Doesn't like nexium due to diarrhea Will have him try omeprazole or ranitidine to see if they are better tolerated

## 2013-04-24 NOTE — Assessment & Plan Note (Signed)
BP Readings from Last 3 Encounters:  04/24/13 128/68  10/24/12 130/70  07/09/12 125/80   Good control Due for labs

## 2013-04-24 NOTE — Progress Notes (Signed)
Subjective:    Patient ID: Bryan David, male    DOB: 04-Mar-1925, 79 y.o.   MRN: [id number]  HPI Here with wife Small garden this year-- "I'm getting too old" Does have some hand pain Trouble with prolonged standing or walking---gets bad back pain  No chest pain  No SOB No dizziness or syncope No edema  Hasn't been taking the nexium regularly Thinks it caused diarrhea Uses if he is going to be doing a lot of bending ---causes heartburn No swallowing problems Wife does note some nausea though---vomiting after bending, etc  Current Outpatient Prescriptions on File Prior to Visit  Medication Sig Dispense Refill  . aspirin EC 81 MG tablet Take 162 mg by mouth daily.      Marland Kitchen ibuprofen (ADVIL,MOTRIN) 200 MG tablet Take 200 mg by mouth daily as needed. For pain      . metoprolol tartrate (LOPRESSOR) 25 MG tablet Take 0.5 tablets (12.5 mg total) by mouth 2 (two) times daily.  180 tablet  3  . triamterene-hydrochlorothiazide (MAXZIDE-25) 37.5-25 MG per tablet TAKE ONE TABLET BY MOUTH EVERY DAY  90 tablet  0   No current facility-administered medications on file prior to visit.    Allergies  Allergen Reactions  . Acetaminophen Other (See Comments)    "made him feel silly"    Past Medical History  Diagnosis Date  . Bladder cancer   . Renal cancer   . Pulmonary embolus   . GERD (gastroesophageal reflux disease)   . Arthritis   . COPD (chronic obstructive pulmonary disease)   . Hypertension   . Urinary frequency   . Carpal tunnel syndrome   . Vitamin B12 deficiency   . Hiatal hernia   . Esophageal stricture   . Osteoarthritis   . Diverticulosis   . PE (pulmonary embolism)   . Choledocholithiasis   . Biliary acute pancreatitis   . Anxiety   . Shortness of breath   . Pneumonia     Past Surgical History  Procedure Laterality Date  . Inguinal hernia repair  1998    right  . Cholecystectomy    . Partial nephrectomy    . Esophagogastroduodenoscopy    . Carpal tunnel  release  12/11    Right---Dr Mina Marble  . Transurethral resection of prostate      limited  . Cystostomy w/ bladder biopsy      multiple    Family History  Problem Relation Age of Onset  . Hypertension Mother   . Colon cancer Neg Hx   . Heart failure Mother   . Stroke Mother   . Stroke Brother   . Heart attack Brother   . Diabetes Sister   . Stroke Sister     History   Social History  . Marital Status: Married    Spouse Name: N/A    Number of Children: 2  . Years of Education: N/A   Occupational History  . retired - Land, still does some produce    Social History Main Topics  . Smoking status: Former Smoker -- 1.00 packs/day for 40 years    Types: Cigarettes    Quit date: 06/25/1981  . Smokeless tobacco: Never Used  . Alcohol Use: No  . Drug Use: No  . Sexually Active: Not on file   Other Topics Concern  . Not on file   Social History Narrative   Enjoys rabbit hunting..No living will..Asks for wife as health care POA..Willing to try resuscitation but no prolonged  machines..Not sure about feeding tube            Review of Systems No falls Appetite is fine Weight is stable Sleeps well--especially after an active    Objective:   Physical Exam  Constitutional: He appears well-developed and well-nourished. No distress.  Neck: Normal range of motion. Neck supple. No thyromegaly present.  Cardiovascular: Normal rate, regular rhythm and normal heart sounds.  Exam reveals no gallop.   No murmur heard. Pulmonary/Chest: Effort normal and breath sounds normal. No respiratory distress. He has no wheezes. He has no rales.  Abdominal: Soft. There is no tenderness.  Musculoskeletal: He exhibits no edema and no tenderness.  Lymphadenopathy:    He has no cervical adenopathy.  Psychiatric: He has a normal mood and affect. His behavior is normal.          Assessment & Plan:

## 2013-04-24 NOTE — Patient Instructions (Signed)
Please try omeprazole 20mg  daily or every other day---- or ranitidine 150mg  twice a day--- to see if those can control your acid and nausea without causing the diarrhea.

## 2013-04-25 ENCOUNTER — Encounter: Payer: Self-pay | Admitting: *Deleted

## 2013-09-05 ENCOUNTER — Other Ambulatory Visit: Payer: Self-pay | Admitting: Internal Medicine

## 2013-10-23 ENCOUNTER — Ambulatory Visit (INDEPENDENT_AMBULATORY_CARE_PROVIDER_SITE_OTHER): Payer: Medicare Other | Admitting: Internal Medicine

## 2013-10-23 ENCOUNTER — Encounter: Payer: Self-pay | Admitting: Internal Medicine

## 2013-10-23 VITALS — BP 120/60 | HR 71 | Temp 97.5°F | Ht 72.0 in | Wt 169.0 lb

## 2013-10-23 DIAGNOSIS — I471 Supraventricular tachycardia: Secondary | ICD-10-CM

## 2013-10-23 DIAGNOSIS — J449 Chronic obstructive pulmonary disease, unspecified: Secondary | ICD-10-CM

## 2013-10-23 DIAGNOSIS — I7300 Raynaud's syndrome without gangrene: Secondary | ICD-10-CM

## 2013-10-23 DIAGNOSIS — I10 Essential (primary) hypertension: Secondary | ICD-10-CM

## 2013-10-23 DIAGNOSIS — J4489 Other specified chronic obstructive pulmonary disease: Secondary | ICD-10-CM

## 2013-10-23 DIAGNOSIS — Z23 Encounter for immunization: Secondary | ICD-10-CM

## 2013-10-23 DIAGNOSIS — M159 Polyosteoarthritis, unspecified: Secondary | ICD-10-CM

## 2013-10-23 DIAGNOSIS — I498 Other specified cardiac arrhythmias: Secondary | ICD-10-CM

## 2013-10-23 MED ORDER — TRIAMTERENE-HCTZ 37.5-25 MG PO TABS: 1.0000 | ORAL_TABLET | Freq: Every day | ORAL | Status: DC

## 2013-10-23 MED ORDER — METOPROLOL TARTRATE 25 MG PO TABS: 12.5000 mg | ORAL_TABLET | Freq: Two times a day (BID) | ORAL | Status: DC

## 2013-10-23 NOTE — Assessment & Plan Note (Signed)
Mild Hasn't needed the ibuprofen lately

## 2013-10-23 NOTE — Progress Notes (Signed)
Pre-visit discussion using our clinic review tool. No additional management support is needed unless otherwise documented below in the visit note.  

## 2013-10-23 NOTE — Assessment & Plan Note (Signed)
Chronic cough Stable respiratory status

## 2013-10-23 NOTE — Assessment & Plan Note (Signed)
Mild now Would consider change to CCB if worsens

## 2013-10-23 NOTE — Addendum Note (Signed)
Addended by: Sueanne Margarita on: 10/23/2013 09:28 AM   Modules accepted: Orders

## 2013-10-23 NOTE — Assessment & Plan Note (Signed)
No recurrence on the metoprolol 

## 2013-10-23 NOTE — Progress Notes (Signed)
Subjective:    Patient ID: Bryan David, male    DOB: 1925/05/17, 79 y.o.   MRN: [id number]  HPI Here with wife  Doing okay Had loose stools yesterday---this is the first time in a long time 5-6 episodes No blood  No abdominal pain Appetite is fine  No nexium in a while No recent acid reflux  No SOB Persistent cough-- only occasional sputum Relates to past smoking damage---quit ~30 years ago  No chest pain No recent palpitations No edema No dizziness or syncope  Hands stay cold No sig arthritis changes Uses the ibuprofen rarely  Current Outpatient Prescriptions on File Prior to Visit  Medication Sig Dispense Refill  . aspirin EC 81 MG tablet Take 162 mg by mouth daily.      Marland Kitchen ibuprofen (ADVIL,MOTRIN) 200 MG tablet Take 200 mg by mouth daily as needed. For pain      . triamterene-hydrochlorothiazide (MAXZIDE-25) 37.5-25 MG per tablet TAKE ONE TABLET BY MOUTH ONCE DAILY  90 tablet  0   No current facility-administered medications on file prior to visit.    Allergies  Allergen Reactions  . Acetaminophen Other (See Comments)    "made him feel silly"    Past Medical History  Diagnosis Date  . Bladder cancer   . Renal cancer   . Pulmonary embolus   . GERD (gastroesophageal reflux disease)   . Arthritis   . COPD (chronic obstructive pulmonary disease)   . Hypertension   . Urinary frequency   . Carpal tunnel syndrome   . Vitamin B12 deficiency   . Hiatal hernia   . Esophageal stricture   . Osteoarthritis   . Diverticulosis   . PE (pulmonary embolism)   . Choledocholithiasis   . Biliary acute pancreatitis   . Anxiety   . Shortness of breath   . Pneumonia     Past Surgical History  Procedure Laterality Date  . Inguinal hernia repair  1998    right  . Cholecystectomy    . Partial nephrectomy    . Esophagogastroduodenoscopy    . Carpal tunnel release  12/11    Right---Dr Mina Marble  . Transurethral resection of prostate      limited  . Cystostomy w/  bladder biopsy      multiple    Family History  Problem Relation Age of Onset  . Hypertension Mother   . Colon cancer Neg Hx   . Heart failure Mother   . Stroke Mother   . Stroke Brother   . Heart attack Brother   . Diabetes Sister   . Stroke Sister     History   Social History  . Marital Status: Married    Spouse Name: N/A    Number of Children: 2  . Years of Education: N/A   Occupational History  . retired - Land, still does some produce    Social History Main Topics  . Smoking status: Former Smoker -- 1.00 packs/day for 40 years    Types: Cigarettes    Quit date: 06/25/1981  . Smokeless tobacco: Never Used  . Alcohol Use: No  . Drug Use: No  . Sexual Activity: Not on file   Other Topics Concern  . Not on file   Social History Narrative   Enjoys rabbit hunting..No living will..Asks for wife as health care POA..Willing to try resuscitation but no prolonged machines..Not sure about feeding tube             Review of Systems  Weight is down 2# Sleeps okay--unless something on his mind Hearing is poor Ongoing back pain---hurts if he is standing up for a while    Objective:   Physical Exam  Constitutional: He appears well-developed and well-nourished. No distress.  Neck: Normal range of motion. Neck supple. No thyromegaly present.  Cardiovascular: Normal rate, regular rhythm and normal heart sounds.  Exam reveals no gallop.   No murmur heard. Pulmonary/Chest: Effort normal and breath sounds normal. No respiratory distress. He has no wheezes. He has no rales.  Abdominal: Soft. There is no tenderness.  Musculoskeletal: He exhibits no edema and no tenderness.  Lymphadenopathy:    He has no cervical adenopathy.  Psychiatric: He has a normal mood and affect. His behavior is normal.          Assessment & Plan:

## 2013-10-23 NOTE — Progress Notes (Signed)
  Subjective:    Patient ID: Bryan David, male    DOB: Jul 25, 1925, 79 y.o.   MRN: [id number]  HPI    Review of Systems     Objective:   Physical Exam  Cardiovascular:  Regular but frequent extra beats  Pulmonary/Chest:  Decreased breath sounds but clear          Assessment & Plan:

## 2013-10-23 NOTE — Assessment & Plan Note (Signed)
BP Readings from Last 3 Encounters:  10/23/13 120/60  04/24/13 128/68  10/24/12 130/70   Good control No changes

## 2014-05-13 ENCOUNTER — Encounter: Payer: Self-pay | Admitting: Internal Medicine

## 2014-05-13 ENCOUNTER — Ambulatory Visit (INDEPENDENT_AMBULATORY_CARE_PROVIDER_SITE_OTHER): Payer: Medicare Other | Admitting: Internal Medicine

## 2014-05-13 VITALS — BP 128/68 | HR 60 | Temp 98.2°F | Ht 73.0 in | Wt 171.0 lb

## 2014-05-13 DIAGNOSIS — I471 Supraventricular tachycardia: Secondary | ICD-10-CM

## 2014-05-13 DIAGNOSIS — I498 Other specified cardiac arrhythmias: Secondary | ICD-10-CM

## 2014-05-13 DIAGNOSIS — K222 Esophageal obstruction: Secondary | ICD-10-CM

## 2014-05-13 DIAGNOSIS — I10 Essential (primary) hypertension: Secondary | ICD-10-CM

## 2014-05-13 DIAGNOSIS — J449 Chronic obstructive pulmonary disease, unspecified: Secondary | ICD-10-CM

## 2014-05-13 DIAGNOSIS — Z0000 Encounter for general adult medical examination without abnormal findings: Secondary | ICD-10-CM | POA: Insufficient documentation

## 2014-05-13 DIAGNOSIS — Z23 Encounter for immunization: Secondary | ICD-10-CM

## 2014-05-13 DIAGNOSIS — C679 Malignant neoplasm of bladder, unspecified: Secondary | ICD-10-CM

## 2014-05-13 LAB — CBC WITH DIFFERENTIAL/PLATELET
Basophils Absolute: 0 10*3/uL (ref 0.0–0.1)
Basophils Relative: 0.6 % (ref 0.0–3.0)
EOS ABS: 0.2 10*3/uL (ref 0.0–0.7)
EOS PCT: 2.9 % (ref 0.0–5.0)
HEMATOCRIT: 43.6 % (ref 39.0–52.0)
Hemoglobin: 14.5 g/dL (ref 13.0–17.0)
LYMPHS ABS: 1.6 10*3/uL (ref 0.7–4.0)
Lymphocytes Relative: 20.6 % (ref 12.0–46.0)
MCHC: 33.3 g/dL (ref 30.0–36.0)
MCV: 86.1 fl (ref 78.0–100.0)
MONO ABS: 0.7 10*3/uL (ref 0.1–1.0)
Monocytes Relative: 9.5 % (ref 3.0–12.0)
NEUTROS PCT: 66.4 % (ref 43.0–77.0)
Neutro Abs: 5.1 10*3/uL (ref 1.4–7.7)
PLATELETS: 145 10*3/uL — AB (ref 150.0–400.0)
RBC: 5.07 Mil/uL (ref 4.22–5.81)
RDW: 14.3 % (ref 11.5–15.5)
WBC: 7.7 10*3/uL (ref 4.0–10.5)

## 2014-05-13 LAB — LIPID PANEL
CHOLESTEROL: 187 mg/dL (ref 0–200)
HDL: 35.6 mg/dL — ABNORMAL LOW (ref >39.00)
LDL CALC: 134 mg/dL — AB (ref 0–99)
NONHDL: 151.4
Total CHOL/HDL Ratio: 5
Triglycerides: 89 mg/dL (ref 0.0–149.0)
VLDL: 17.8 mg/dL (ref 0.0–40.0)

## 2014-05-13 LAB — COMPREHENSIVE METABOLIC PANEL
ALBUMIN: 4.2 g/dL (ref 3.5–5.2)
ALK PHOS: 49 U/L (ref 39–117)
ALT: 14 U/L (ref 0–53)
AST: 24 U/L (ref 0–37)
BILIRUBIN TOTAL: 1.2 mg/dL (ref 0.2–1.2)
BUN: 10 mg/dL (ref 6–23)
CO2: 30 mEq/L (ref 19–32)
Calcium: 9.6 mg/dL (ref 8.4–10.5)
Chloride: 100 mEq/L (ref 96–112)
Creatinine, Ser: 1.1 mg/dL (ref 0.4–1.5)
GFR: 70.32 mL/min (ref >60.00)
GLUCOSE: 92 mg/dL (ref 70–99)
POTASSIUM: 3.8 meq/L (ref 3.5–5.1)
SODIUM: 138 meq/L (ref 135–145)
TOTAL PROTEIN: 7.2 g/dL (ref 6.0–8.3)

## 2014-05-13 LAB — T4, FREE: FREE T4: 0.96 ng/dL (ref 0.60–1.60)

## 2014-05-13 LAB — TSH: TSH: 0.26 u[IU]/mL — AB (ref 0.35–4.50)

## 2014-05-13 NOTE — Assessment & Plan Note (Signed)
Mild symptoms only No meds for this

## 2014-05-13 NOTE — Addendum Note (Signed)
Addended by: Despina Hidden on: 05/13/2014 02:38 PM   Modules accepted: Orders

## 2014-05-13 NOTE — Progress Notes (Signed)
Pre visit review using our clinic review tool, if applicable. No additional management support is needed unless otherwise documented below in the visit note. 

## 2014-05-13 NOTE — Assessment & Plan Note (Signed)
No apparent recurrence 

## 2014-05-13 NOTE — Progress Notes (Signed)
Subjective:    Patient ID: Bryan David, male    DOB: 10/28/1925, 80 y.o.   MRN: [id number]  HPI Here for Medicare wellness, physical and follow up Reviewed his form Only sees Dr Risa Grill for cancer follow up No tobacco or alcohol No exercise but still keeps big garden Vision okay but hearing is pretty bad---not happy with the hearing aide he has Independent in instrumental ADLs No problems with memory or thinking--- just has trouble remembering names No falls No depression or anhedonia  Does have some bad cramps at night Angie Fava do help If ankles get cold--it comes on more  No chest pain or SOB No dizziness or syncope No edema lately No palpitations  Will get back pain if he walks an extended time (or prolonged standing).  Some pain in hands also  No heartburn of note No dysphagia but occasionally "just throws up". May be with dry food if he eats too fast  Current Outpatient Prescriptions on File Prior to Visit  Medication Sig Dispense Refill  . aspirin EC 81 MG tablet Take 81 mg by mouth 2 (two) times daily as needed.       . metoprolol tartrate (LOPRESSOR) 25 MG tablet Take 0.5 tablets (12.5 mg total) by mouth 2 (two) times daily.  90 tablet  3  . triamterene-hydrochlorothiazide (MAXZIDE-25) 37.5-25 MG per tablet Take 1 tablet by mouth daily.  90 tablet  3   No current facility-administered medications on file prior to visit.    Allergies  Allergen Reactions  . Acetaminophen Other (See Comments)    "made him feel silly"    Past Medical History  Diagnosis Date  . Bladder cancer   . Renal cancer   . Pulmonary embolus   . GERD (gastroesophageal reflux disease)   . Arthritis   . COPD (chronic obstructive pulmonary disease)   . Hypertension   . Urinary frequency   . Carpal tunnel syndrome   . Vitamin B12 deficiency   . Hiatal hernia   . Esophageal stricture   . Osteoarthritis   . Diverticulosis   . PE (pulmonary embolism)   . Choledocholithiasis   . Biliary  acute pancreatitis   . Anxiety   . Shortness of breath   . Pneumonia     Past Surgical History  Procedure Laterality Date  . Inguinal hernia repair  1998    right  . Cholecystectomy    . Partial nephrectomy    . Esophagogastroduodenoscopy    . Carpal tunnel release  12/11    Right---Dr Burney Gauze  . Transurethral resection of prostate      limited  . Cystostomy w/ bladder biopsy      multiple    Family History  Problem Relation Age of Onset  . Hypertension Mother   . Colon cancer Neg Hx   . Heart failure Mother   . Stroke Mother   . Stroke Brother   . Heart attack Brother   . Diabetes Sister   . Stroke Sister     History   Social History  . Marital Status: Married    Spouse Name: N/A    Number of Children: 2  . Years of Education: N/A   Occupational History  . retired - Systems developer, still does some produce    Social History Main Topics  . Smoking status: Former Smoker -- 1.00 packs/day for 40 years    Types: Cigarettes    Quit date: 06/25/1981  . Smokeless tobacco: Never Used  .  Alcohol Use: No  . Drug Use: No  . Sexual Activity: Not on file   Other Topics Concern  . Not on file   Social History Narrative   Has living will.   Asks for wife as health care POA.   Willing to try resuscitation but no prolonged machines.   Not sure about feeding tube               Review of Systems  Constitutional: Negative for fatigue and unexpected weight change.       Wears seat nbelt  HENT: Positive for hearing loss and tinnitus. Negative for dental problem.        Edentulous Only wears top dentures  Eyes: Positive for visual disturbance.       Some vision loss---hasn't been seen in a long time Early cataracts --may have progressed  Respiratory: Positive for cough. Negative for chest tightness and shortness of breath.        Regular cough--occ brings up phlegm. No progression. May be worse after eating  Cardiovascular: Negative for chest pain, palpitations  and leg swelling.  Gastrointestinal: Negative for nausea, vomiting, abdominal pain, constipation and blood in stool.  Endocrine: Positive for cold intolerance.       Protects hands in cold---and wears gloves to bed  Genitourinary: Positive for frequency and difficulty urinating.       Slow stream  Musculoskeletal: Positive for arthralgias and back pain. Negative for joint swelling.  Skin: Negative for rash.       Picking at place on left hand  Allergic/Immunologic: Negative for environmental allergies and immunocompromised state.  Neurological: Positive for numbness. Negative for dizziness, syncope, weakness, light-headedness and headaches.  Psychiatric/Behavioral: Negative for sleep disturbance and dysphoric mood. The patient is not nervous/anxious.        Sleeps a lot in the winter       Objective:   Physical Exam  Constitutional: He is oriented to person, place, and time. He appears well-developed and well-nourished. No distress.  HENT:  Head: Normocephalic and atraumatic.  Right Ear: External ear normal.  Left Ear: External ear normal.  Mouth/Throat: Oropharynx is clear and moist. No oropharyngeal exudate.  Eyes: Conjunctivae and EOM are normal. Pupils are equal, round, and reactive to light.  Neck: Normal range of motion. Neck supple. No thyromegaly present.  Cardiovascular: Normal rate, regular rhythm and normal heart sounds.  Exam reveals no gallop.   No murmur heard. ?very faint pedal pulses  Pulmonary/Chest: Effort normal. No respiratory distress. He has no wheezes.  Slightly decreased breath sounds Light bibasilar crackles  Abdominal: Soft. There is no tenderness.  Musculoskeletal: He exhibits no edema and no tenderness.  Lymphadenopathy:    He has no cervical adenopathy.  Neurological: He is alert and oriented to person, place, and time.  President-- "Obama, Bush, CLinton" [phone] D-l--o-w Recall 2/3  Skin:  Keratotic area on dorsum of left  hand---doesn't look precancerous seb keratoses          Assessment & Plan:

## 2014-05-13 NOTE — Assessment & Plan Note (Signed)
Mild cough and DOE without recent change No meds

## 2014-05-13 NOTE — Assessment & Plan Note (Signed)
Sees Dr Risa Grill

## 2014-05-13 NOTE — Assessment & Plan Note (Signed)
BP Readings from Last 3 Encounters:  05/13/14 128/68  10/23/13 120/60  04/24/13 128/68   Good control Due for labs

## 2014-05-13 NOTE — Assessment & Plan Note (Signed)
I have personally reviewed the Medicare Annual Wellness questionnaire and have noted 1. The patient's medical and social history 2. Their use of alcohol, tobacco or illicit drugs 3. Their current medications and supplements 4. The patient's functional ability including ADL's, fall risks, home safety risks and hearing or visual             impairment. 5. Diet and physical activities 6. Evidence for depression or mood disorders  The patients weight, height, BMI and visual acuity have been recorded in the chart I have made referrals, counseling and provided education to the patient based review of the above and I have provided the pt with a written personalized care plan for preventive services.  I have provided you with a copy of your personalized plan for preventive services. Please take the time to review along with your updated medication list.  prevnar today Had shingles--prefers no zostavax No cancer screening Yearly flu shot

## 2014-05-14 ENCOUNTER — Telehealth: Payer: Self-pay | Admitting: Internal Medicine

## 2014-05-14 NOTE — Telephone Encounter (Signed)
Relevant patient education mailed to patient.  

## 2014-05-16 ENCOUNTER — Encounter: Payer: Self-pay | Admitting: *Deleted

## 2014-05-21 ENCOUNTER — Encounter: Payer: Medicare Other | Admitting: Internal Medicine

## 2014-09-24 ENCOUNTER — Ambulatory Visit (INDEPENDENT_AMBULATORY_CARE_PROVIDER_SITE_OTHER): Payer: Medicare Other

## 2014-09-24 DIAGNOSIS — Z23 Encounter for immunization: Secondary | ICD-10-CM

## 2014-11-04 ENCOUNTER — Telehealth: Payer: Self-pay | Admitting: Internal Medicine

## 2014-11-04 NOTE — Telephone Encounter (Signed)
Pt's wife dropping off Disability parking form to be filled out by Dr Silvio Pate. Please call pt's spouse when form is ready to be picked up. Forms placed on Dee's desk to give to Dr Silvio Pate. Thank you

## 2014-11-05 NOTE — Telephone Encounter (Signed)
I left a message on patient's voicemail to let him know form is ready to be picked up.

## 2014-11-05 NOTE — Telephone Encounter (Signed)
Form done No charge 

## 2014-12-08 ENCOUNTER — Other Ambulatory Visit: Payer: Self-pay | Admitting: Internal Medicine

## 2015-02-13 ENCOUNTER — Other Ambulatory Visit: Payer: Self-pay | Admitting: Internal Medicine

## 2015-05-20 ENCOUNTER — Ambulatory Visit (INDEPENDENT_AMBULATORY_CARE_PROVIDER_SITE_OTHER): Payer: Medicare Other | Admitting: Internal Medicine

## 2015-05-20 ENCOUNTER — Encounter: Payer: Self-pay | Admitting: Internal Medicine

## 2015-05-20 VITALS — BP 110/70 | HR 58 | Temp 97.7°F | Ht 67.0 in | Wt 157.0 lb

## 2015-05-20 DIAGNOSIS — Z0000 Encounter for general adult medical examination without abnormal findings: Secondary | ICD-10-CM | POA: Diagnosis not present

## 2015-05-20 DIAGNOSIS — D696 Thrombocytopenia, unspecified: Secondary | ICD-10-CM | POA: Insufficient documentation

## 2015-05-20 DIAGNOSIS — I10 Essential (primary) hypertension: Secondary | ICD-10-CM | POA: Diagnosis not present

## 2015-05-20 DIAGNOSIS — R634 Abnormal weight loss: Secondary | ICD-10-CM | POA: Insufficient documentation

## 2015-05-20 DIAGNOSIS — J449 Chronic obstructive pulmonary disease, unspecified: Secondary | ICD-10-CM

## 2015-05-20 DIAGNOSIS — I471 Supraventricular tachycardia: Secondary | ICD-10-CM | POA: Diagnosis not present

## 2015-05-20 DIAGNOSIS — K222 Esophageal obstruction: Secondary | ICD-10-CM

## 2015-05-20 LAB — COMPREHENSIVE METABOLIC PANEL
ALK PHOS: 64 U/L (ref 39–117)
ALT: 11 U/L (ref 0–53)
AST: 20 U/L (ref 0–37)
Albumin: 4.6 g/dL (ref 3.5–5.2)
BILIRUBIN TOTAL: 1 mg/dL (ref 0.2–1.2)
BUN: 19 mg/dL (ref 6–23)
CO2: 31 mEq/L (ref 19–32)
Calcium: 10.2 mg/dL (ref 8.4–10.5)
Chloride: 100 mEq/L (ref 96–112)
Creatinine, Ser: 1.22 mg/dL (ref 0.40–1.50)
GFR: 59.65 mL/min — ABNORMAL LOW (ref >60.00)
Glucose, Bld: 104 mg/dL — ABNORMAL HIGH (ref 70–99)
Potassium: 4.3 mEq/L (ref 3.5–5.1)
SODIUM: 138 meq/L (ref 135–145)
Total Protein: 7.8 g/dL (ref 6.0–8.3)

## 2015-05-20 LAB — CBC WITH DIFFERENTIAL/PLATELET
BASOS ABS: 0 10*3/uL (ref 0.0–0.1)
Basophils Relative: 0.4 % (ref 0.0–3.0)
Eosinophils Absolute: 0.2 10*3/uL (ref 0.0–0.7)
Eosinophils Relative: 2.8 % (ref 0.0–5.0)
HCT: 47.5 % (ref 39.0–52.0)
HEMOGLOBIN: 15.5 g/dL (ref 13.0–17.0)
LYMPHS PCT: 20.5 % (ref 12.0–46.0)
Lymphs Abs: 1.5 10*3/uL (ref 0.7–4.0)
MCHC: 32.7 g/dL (ref 30.0–36.0)
MCV: 86.2 fl (ref 78.0–100.0)
MONOS PCT: 9 % (ref 3.0–12.0)
Monocytes Absolute: 0.7 10*3/uL (ref 0.1–1.0)
Neutro Abs: 4.9 10*3/uL (ref 1.4–7.7)
Neutrophils Relative %: 67.3 % (ref 43.0–77.0)
Platelets: 151 10*3/uL (ref 150.0–400.0)
RBC: 5.51 Mil/uL (ref 4.22–5.81)
RDW: 13.8 % (ref 11.5–15.5)
WBC: 7.3 10*3/uL (ref 4.0–10.5)

## 2015-05-20 LAB — T4, FREE: Free T4: 1.01 ng/dL (ref 0.60–1.60)

## 2015-05-20 MED ORDER — METOPROLOL TARTRATE 25 MG PO TABS: 12.5000 mg | ORAL_TABLET | Freq: Two times a day (BID) | ORAL | Status: DC

## 2015-05-20 NOTE — Progress Notes (Signed)
Subjective:    Patient ID: Bryan David, male    DOB: 06-May-1925, 81 y.o.   MRN: [id number]  HPI Here for Medicare wellness and follow up of chronic medical conditions Wife is with him Reviewed form and advanced directives Still only sees Dr Risa Grill--- cancer follow up No tobacco or alcohol Stays active in garden---not very active in winter Independent with instrumental ADLs Vision is okay Hearing remains poor--not interested in hearing aide No falls No depression or anhedonia No obvious cognitive problems (wife agrees)  Stressful time Son very sick--seems to have metastatic cancer (?melanoma)  Has been working very hard in his garden Has had to hoe by hand a lot This is probably why he has lost 14#  Has had to be careful with what he eats due to swallowing problems Eats main meal in middle of the day to avoid night problems Doesn't get food stuck and no regurgitation Eats slower  Sees Dr Risa Grill once a year No hematuria No voiding problems--- but stream is slow and has frequency  No palpitations No chest pain No SOB--- stamina in garden remains good No dizziness or syncope  Has some arthritis problems--mostly in hands Hands do get cold (and ankles)---- turn blue at times but pain is only in knuckles Does use gloves-- even just in air conditioning  Current Outpatient Prescriptions on File Prior to Visit  Medication Sig Dispense Refill  . aspirin EC 81 MG tablet Take 81 mg by mouth 2 (two) times daily as needed.     . metoprolol tartrate (LOPRESSOR) 25 MG tablet TAKE ONE-HALF TABLET BY MOUTH TWICE DAILY 90 tablet 0  . triamterene-hydrochlorothiazide (MAXZIDE-25) 37.5-25 MG per tablet TAKE ONE TABLET BY MOUTH ONCE DAILY 90 tablet 0   No current facility-administered medications on file prior to visit.    Allergies  Allergen Reactions  . Acetaminophen Other (See Comments)    "made him feel silly"    Past Medical History  Diagnosis Date  . Bladder cancer   .  Renal cancer   . Pulmonary embolus   . GERD (gastroesophageal reflux disease)   . Arthritis   . COPD (chronic obstructive pulmonary disease)   . Hypertension   . Urinary frequency   . Carpal tunnel syndrome   . Vitamin B12 deficiency   . Hiatal hernia   . Esophageal stricture   . Osteoarthritis   . Diverticulosis   . PE (pulmonary embolism)   . Choledocholithiasis   . Biliary acute pancreatitis   . Anxiety   . Shortness of breath   . Pneumonia     Past Surgical History  Procedure Laterality Date  . Inguinal hernia repair  1998    right  . Cholecystectomy    . Partial nephrectomy    . Esophagogastroduodenoscopy    . Carpal tunnel release  12/11    Right---Dr Burney Gauze  . Transurethral resection of prostate      limited  . Cystostomy w/ bladder biopsy      multiple    Family History  Problem Relation Age of Onset  . Hypertension Mother   . Colon cancer Neg Hx   . Heart failure Mother   . Stroke Mother   . Stroke Brother   . Heart attack Brother   . Diabetes Sister   . Stroke Sister     History   Social History  . Marital Status: Married    Spouse Name: N/A  . Number of Children: 2  . Years of  Education: N/A   Occupational History  . retired - Systems developer, still does some produce    Social History Main Topics  . Smoking status: Former Smoker -- 1.00 packs/day for 40 years    Types: Cigarettes    Quit date: 06/25/1981  . Smokeless tobacco: Never Used  . Alcohol Use: No  . Drug Use: No  . Sexual Activity: Not on file   Other Topics Concern  . Not on file   Social History Narrative   Has living will.   Asks for wife as health care POA.   Willing to try resuscitation but no prolonged machines.   Not sure about feeding tube               Review of Systems Sleeps okay usually--unless mind wandering (like plans for the next day) Nocturia x 2-3----stable Bowels are okay--variable but not constipated No suspicious lesions on skin--does get  white areas on hands Wears seat belt Has dentures No excessive bleeding or bruising    Objective:   Physical Exam  Constitutional: He is oriented to person, place, and time. He appears well-developed and well-nourished. No distress.  HENT:  Mouth/Throat: Oropharynx is clear and moist. No oropharyngeal exudate.  Neck: No thyromegaly present.  Cardiovascular: Normal rate, regular rhythm and normal heart sounds.  Exam reveals no gallop.   No murmur heard. Faint distal pulses  Pulmonary/Chest: Effort normal and breath sounds normal. No respiratory distress. He has no wheezes. He has no rales.  Abdominal: Soft. There is no tenderness.  Musculoskeletal: He exhibits no edema or tenderness.  Lymphadenopathy:    He has no cervical adenopathy.  Neurological: He is alert and oriented to person, place, and time.  President-- "Bryan David, movie star" [phone] D-l-r-o-w  Some difficulty Recall 0/3  Skin:  No worrisome lesions Hypertrophic changes on dorsum of L>R hand Mycotic toenails  Psychiatric: He has a normal mood and affect. His behavior is normal.          Assessment & Plan:

## 2015-05-20 NOTE — Assessment & Plan Note (Signed)
No obvious recurrence On the beta blocker

## 2015-05-20 NOTE — Patient Instructions (Signed)
Stop the triamterene/HCTZ. Check your weight a couple of times a week--- let me know if it continues to go down.

## 2015-05-20 NOTE — Assessment & Plan Note (Signed)
On labs last year Will recheck now

## 2015-05-20 NOTE — Assessment & Plan Note (Addendum)
I have personally reviewed the Medicare Annual Wellness questionnaire and have noted 1. The patient's medical and social history 2. Their use of alcohol, tobacco or illicit drugs 3. Their current medications and supplements 4. The patient's functional ability including ADL's, fall risks, home safety risks and hearing or visual             impairment. 5. Diet and physical activities 6. Evidence for depression or mood disorders  The patients weight, height, BMI and visual acuity have been recorded in the chart I have made referrals, counseling and provided education to the patient based review of the above and I have provided the pt with a written personalized care plan for preventive services.  I have provided you with a copy of your personalized plan for preventive services. Please take the time to review along with your updated medication list.  No cancer screening due to age Prefers no zostavax Gets yearly flu shot Mild memory issues but no functional problems

## 2015-05-20 NOTE — Assessment & Plan Note (Signed)
BP Readings from Last 3 Encounters:  05/20/15 110/70  05/13/14 128/68  10/23/13 120/60   BP low now Will try off the diuretic

## 2015-05-20 NOTE — Progress Notes (Signed)
Pre visit review using our clinic review tool, if applicable. No additional management support is needed unless otherwise documented below in the visit note. 

## 2015-05-20 NOTE — Assessment & Plan Note (Signed)
May just be from hard work in garden Wife will watch and let me know if he loses any more weight

## 2015-05-20 NOTE — Assessment & Plan Note (Signed)
Has to be careful with swallowing ?related to weight loss Wife will monitor

## 2015-05-20 NOTE — Assessment & Plan Note (Signed)
No sig symptoms Stopped smoking years ago

## 2015-05-21 ENCOUNTER — Encounter: Payer: Self-pay | Admitting: *Deleted

## 2015-10-21 ENCOUNTER — Encounter: Payer: Self-pay | Admitting: Gastroenterology

## 2015-10-23 ENCOUNTER — Ambulatory Visit (INDEPENDENT_AMBULATORY_CARE_PROVIDER_SITE_OTHER): Payer: Medicare Other

## 2015-10-23 DIAGNOSIS — Z23 Encounter for immunization: Secondary | ICD-10-CM | POA: Diagnosis not present

## 2016-03-11 DIAGNOSIS — N401 Enlarged prostate with lower urinary tract symptoms: Secondary | ICD-10-CM | POA: Diagnosis not present

## 2016-03-11 DIAGNOSIS — K4090 Unilateral inguinal hernia, without obstruction or gangrene, not specified as recurrent: Secondary | ICD-10-CM | POA: Diagnosis not present

## 2016-03-11 DIAGNOSIS — R351 Nocturia: Secondary | ICD-10-CM | POA: Diagnosis not present

## 2016-03-11 DIAGNOSIS — Z0000 Encounter for general adult medical examination without abnormal findings: Secondary | ICD-10-CM | POA: Diagnosis not present

## 2016-03-11 DIAGNOSIS — Z8551 Personal history of malignant neoplasm of bladder: Secondary | ICD-10-CM | POA: Diagnosis not present

## 2016-06-08 ENCOUNTER — Ambulatory Visit (INDEPENDENT_AMBULATORY_CARE_PROVIDER_SITE_OTHER): Payer: Medicare Other | Admitting: Internal Medicine

## 2016-06-08 ENCOUNTER — Telehealth: Payer: Self-pay | Admitting: Internal Medicine

## 2016-06-08 ENCOUNTER — Encounter: Payer: Self-pay | Admitting: Internal Medicine

## 2016-06-08 VITALS — BP 136/70 | HR 53 | Temp 97.3°F | Ht 69.0 in | Wt 164.0 lb

## 2016-06-08 DIAGNOSIS — IMO0001 Reserved for inherently not codable concepts without codable children: Secondary | ICD-10-CM

## 2016-06-08 DIAGNOSIS — J449 Chronic obstructive pulmonary disease, unspecified: Secondary | ICD-10-CM

## 2016-06-08 DIAGNOSIS — I10 Essential (primary) hypertension: Secondary | ICD-10-CM | POA: Diagnosis not present

## 2016-06-08 DIAGNOSIS — Z0000 Encounter for general adult medical examination without abnormal findings: Secondary | ICD-10-CM | POA: Diagnosis not present

## 2016-06-08 DIAGNOSIS — I350 Nonrheumatic aortic (valve) stenosis: Secondary | ICD-10-CM | POA: Insufficient documentation

## 2016-06-08 DIAGNOSIS — D696 Thrombocytopenia, unspecified: Secondary | ICD-10-CM

## 2016-06-08 DIAGNOSIS — Z7189 Other specified counseling: Secondary | ICD-10-CM

## 2016-06-08 DIAGNOSIS — F39 Unspecified mood [affective] disorder: Secondary | ICD-10-CM

## 2016-06-08 DIAGNOSIS — I471 Supraventricular tachycardia: Secondary | ICD-10-CM | POA: Diagnosis not present

## 2016-06-08 DIAGNOSIS — I700 Atherosclerosis of aorta: Secondary | ICD-10-CM

## 2016-06-08 HISTORY — DX: Unspecified mood (affective) disorder: F39

## 2016-06-08 LAB — COMPREHENSIVE METABOLIC PANEL
ALBUMIN: 4.3 g/dL (ref 3.5–5.2)
ALK PHOS: 62 U/L (ref 39–117)
ALT: 12 U/L (ref 0–53)
AST: 19 U/L (ref 0–37)
BILIRUBIN TOTAL: 0.8 mg/dL (ref 0.2–1.2)
BUN: 14 mg/dL (ref 6–23)
CALCIUM: 9.4 mg/dL (ref 8.4–10.5)
CO2: 32 mEq/L (ref 19–32)
Chloride: 103 mEq/L (ref 96–112)
Creatinine, Ser: 0.96 mg/dL (ref 0.40–1.50)
GFR: 78.46 mL/min (ref >60.00)
Glucose, Bld: 104 mg/dL — ABNORMAL HIGH (ref 70–99)
Potassium: 3.8 mEq/L (ref 3.5–5.1)
Sodium: 140 mEq/L (ref 135–145)
TOTAL PROTEIN: 7.3 g/dL (ref 6.0–8.3)

## 2016-06-08 LAB — CBC WITH DIFFERENTIAL/PLATELET
BASOS PCT: 0.5 % (ref 0.0–3.0)
Basophils Absolute: 0 10*3/uL (ref 0.0–0.1)
EOS PCT: 3.3 % (ref 0.0–5.0)
Eosinophils Absolute: 0.3 10*3/uL (ref 0.0–0.7)
HEMATOCRIT: 43.3 % (ref 39.0–52.0)
HEMOGLOBIN: 14.3 g/dL (ref 13.0–17.0)
LYMPHS PCT: 19.5 % (ref 12.0–46.0)
Lymphs Abs: 1.6 10*3/uL (ref 0.7–4.0)
MCHC: 33.1 g/dL (ref 30.0–36.0)
MCV: 86.7 fl (ref 78.0–100.0)
Monocytes Absolute: 0.6 10*3/uL (ref 0.1–1.0)
Monocytes Relative: 7.7 % (ref 3.0–12.0)
Neutro Abs: 5.5 10*3/uL (ref 1.4–7.7)
Neutrophils Relative %: 69 % (ref 43.0–77.0)
Platelets: 126 10*3/uL — ABNORMAL LOW (ref 150.0–400.0)
RBC: 4.99 Mil/uL (ref 4.22–5.81)
RDW: 14 % (ref 11.5–15.5)
WBC: 8 10*3/uL (ref 4.0–10.5)

## 2016-06-08 MED ORDER — METOPROLOL TARTRATE 25 MG PO TABS: 12.5000 mg | ORAL_TABLET | Freq: Two times a day (BID) | ORAL | Status: DC

## 2016-06-08 NOTE — Assessment & Plan Note (Signed)
No sig symptoms Done with cigarettes (decades ) and chewing

## 2016-06-08 NOTE — Progress Notes (Signed)
Pre visit review using our clinic review tool, if applicable. No additional management support is needed unless otherwise documented below in the visit note. 

## 2016-06-08 NOTE — Assessment & Plan Note (Signed)
I have personally reviewed the Medicare Annual Wellness questionnaire and have noted 1. The patient's medical and social history 2. Their use of alcohol, tobacco or illicit drugs 3. Their current medications and supplements 4. The patient's functional ability including ADL's, fall risks, home safety risks and hearing or visual             impairment. 5. Diet and physical activities 6. Evidence for depression or mood disorders  The patients weight, height, BMI and visual acuity have been recorded in the chart I have made referrals, counseling and provided education to the patient based review of the above and I have provided the pt with a written personalized care plan for preventive services.  I have provided you with a copy of your personalized plan for preventive services. Please take the time to review along with your updated medication list.  No cancer screening due to age No zostavax due to age 82 active and weight is better Yearly flu vaccine

## 2016-06-08 NOTE — Progress Notes (Signed)
Subjective:    Patient ID: Bryan David, male    DOB: 01-23-1925, 82 y.o.   MRN: [id number]  HPI Here for Medicare wellness and follow up of chronic health conditions With wife Reviewed form and advanced directives Reviewed other doctors---still just Dr Risa Grill No tobacco or alcohol No set exercise---just active in his garden Vision is okay Ongoing hearing problems No falls in past year Some trouble with depression---has had a hard time after losing 61 year old son to [place]. Not anhedonic--- gardening and talking to his buddies Mild memory issues but nothing concerning. No loss of function  She notes concern about diarrhea Watery stools intermittently Not mch in past month or so May have to go 4-5 times in a day--- other days, just once No sig abdominal and no blood in stool Doesn't take any med for this Did have diarrhea due to nexium in past Discussed fiber  No longer on nexium No problems with swallowing in general--will have rare dysphagia if he "eats too fast"--something dry No heartburn  Continues to do a lot of work in the garden Discussed avoiding being out their in the heat  No heart trouble No palpitations No chest pain or SOB No dizziness No edema No regular cough  Did have follow up cysto with Dr Risa Grill This was clear Did have inguinal hernia--no action planned Voids fair Trouble with incontinence--- like if he hears water running Nocturia x 2 at least--stable  No abnormal bleeding or bruising Still has microhematuria at times---chronic for him  Current Outpatient Prescriptions on File Prior to Visit  Medication Sig Dispense Refill  . aspirin EC 81 MG tablet Take 81 mg by mouth 2 (two) times daily as needed.     . metoprolol tartrate (LOPRESSOR) 25 MG tablet Take 0.5 tablets (12.5 mg total) by mouth 2 (two) times daily. 90 tablet 3   No current facility-administered medications on file prior to visit.    Allergies  Allergen Reactions  .  Acetaminophen Other (See Comments)    "made him feel silly"    Past Medical History  Diagnosis Date  . Bladder cancer ([place])   . Renal cancer ([place])   . Pulmonary embolus ([place])   . GERD (gastroesophageal reflux disease)   . Arthritis   . COPD (chronic obstructive pulmonary disease) ([place])   . Hypertension   . Urinary frequency   . Carpal tunnel syndrome   . Vitamin B12 deficiency   . Hiatal hernia   . Esophageal stricture   . Osteoarthritis   . Diverticulosis   . PE (pulmonary embolism)   . Choledocholithiasis   . Biliary acute pancreatitis   . Anxiety   . Shortness of breath   . Pneumonia     Past Surgical History  Procedure Laterality Date  . Inguinal hernia repair  1998    right  . Cholecystectomy    . Partial nephrectomy    . Esophagogastroduodenoscopy    . Carpal tunnel release  12/11    Right---Dr Burney Gauze  . Transurethral resection of prostate      limited  . Cystostomy w/ bladder biopsy      multiple    Family History  Problem Relation Age of Onset  . Hypertension Mother   . Colon cancer Neg Hx   . Heart failure Mother   . Stroke Mother   . Stroke Brother   . Heart attack Brother   . Diabetes Sister   . Stroke Sister  Social History   Social History  . Marital Status: Married    Spouse Name: N/A  . Number of Children: 2  . Years of Education: N/A   Occupational History  . retired - Systems developer, still does some produce    Social History Main Topics  . Smoking status: Former Smoker -- 1.00 packs/day for 40 years    Types: Cigarettes    Quit date: 06/25/1981  . Smokeless tobacco: Never Used  . Alcohol Use: No  . Drug Use: No  . Sexual Activity: Not on file   Other Topics Concern  . Not on file   Social History Narrative   Has living will.   Asks for wife as health care POA.   Requests DNR--done 06/08/16   Not sure about feeding tube               Review of Systems Appetite is good Weight is back up 7# Sleeps okay  mostly-- will get "something on my mind" at times Past balance problems are better now Wears seat belt Top denture--none on bottom No sig joint pain---does get some back aching (not bad--no meds for this) No skin changes--chronic hypertrophic spot on left hand---he just keeps it trimmed down    Objective:   Physical Exam  Constitutional: He is oriented to person, place, and time. He appears well-developed. No distress.  Very HOH  HENT:  Mouth/Throat: Oropharynx is clear and moist. No oropharyngeal exudate.  Neck: Normal range of motion. Neck supple. No thyromegaly present.  Cardiovascular: Normal rate, regular rhythm and intact distal pulses.  Exam reveals no gallop.   Faint pedal pulses Gr 3/6 aortic systolic murmur (radiates towards right carotid and LLSB)  Pulmonary/Chest: Effort normal and breath sounds normal. No respiratory distress. He has no wheezes. He has no rales.  Abdominal: Soft. There is no tenderness.  Genitourinary:  Reducible right inguinal hernia (not into scrotum)  Musculoskeletal: He exhibits no edema or tenderness.  Lymphadenopathy:    He has no cervical adenopathy.  Neurological: He is alert and oriented to person, place, and time.  President-- "[employer]" [id number] Recall 2/3  Skin:  Keratotic lesion on dorsum of left hand---doesn't look neoplastic  Psychiatric: He has a normal mood and affect. His behavior is normal.          Assessment & Plan:

## 2016-06-08 NOTE — Assessment & Plan Note (Signed)
Has had down times and sleep problems since son's death No MDD Rx not appropriate

## 2016-06-08 NOTE — Telephone Encounter (Signed)
Rx sent electronically.  

## 2016-06-08 NOTE — Assessment & Plan Note (Signed)
No evidence of bleeding (other than microhematuria) Will recheck

## 2016-06-08 NOTE — Assessment & Plan Note (Signed)
Prominent aortic murmur No symptoms of significant stenosis--no action

## 2016-06-08 NOTE — Assessment & Plan Note (Signed)
BP Readings from Last 3 Encounters:  06/08/16 136/70  05/20/15 110/70  05/13/14 128/68   Good control

## 2016-06-08 NOTE — Telephone Encounter (Signed)
Pt's spouse walked up to window and is requesting a refill on Metoprolol Tartrate 25 mg to [facility] 90 days with refills.

## 2016-06-08 NOTE — Assessment & Plan Note (Signed)
DNR done today 

## 2016-06-08 NOTE — Assessment & Plan Note (Signed)
No apparent recurrence  on the beta blocker

## 2016-06-08 NOTE — Patient Instructions (Signed)
High-Fiber Diet  Fiber, also called dietary fiber, is a type of carbohydrate found in fruits, vegetables, whole grains, and beans. A high-fiber diet can have many health benefits. Your health care provider may recommend a high-fiber diet to help:  · Prevent constipation. Fiber can make your bowel movements more regular.  · Lower your cholesterol.  · Relieve hemorrhoids, uncomplicated diverticulosis, or irritable bowel syndrome.  · Prevent overeating as part of a weight-loss plan.  · Prevent heart disease, type 2 diabetes, and certain cancers.  WHAT IS MY PLAN?  The recommended daily intake of fiber includes:  · 38 grams for men under age 50.  · 30 grams for men over age 50.  · 25 grams for women under age 50.  · 21 grams for women over age 50.  You can get the recommended daily intake of dietary fiber by eating a variety of fruits, vegetables, grains, and beans. Your health care provider may also recommend a fiber supplement if it is not possible to get enough fiber through your diet.  WHAT DO I NEED TO KNOW ABOUT A HIGH-FIBER DIET?  · Fiber supplements have not been widely studied for their effectiveness, so it is better to get fiber through food sources.  · Always check the fiber content on the nutrition facts label of any prepackaged food. Look for foods that contain at least 5 grams of fiber per serving.  · Ask your dietitian if you have questions about specific foods that are related to your condition, especially if those foods are not listed in the following section.  · Increase your daily fiber consumption gradually. Increasing your intake of dietary fiber too quickly may cause bloating, cramping, or gas.  · Drink plenty of water. Water helps you to digest fiber.  WHAT FOODS CAN I EAT?  Grains  Whole-grain breads. Multigrain cereal. Oats and oatmeal. Brown rice. Barley. Bulgur wheat. Millet. Bran muffins. Popcorn. Rye wafer crackers.  Vegetables  Sweet potatoes. Spinach. Kale. Artichokes. Cabbage. Broccoli.  Green peas. Carrots. Squash.  Fruits  Berries. Pears. Apples. Oranges. Avocados. Prunes and raisins. Dried figs.  Meats and Other Protein Sources  Navy, kidney, pinto, and soy beans. Split peas. Lentils. Nuts and seeds.  Dairy  Fiber-fortified yogurt.  Beverages  Fiber-fortified soy milk. Fiber-fortified orange juice.  Other  Fiber bars.  The items listed above may not be a complete list of recommended foods or beverages. Contact your dietitian for more options.  WHAT FOODS ARE NOT RECOMMENDED?  Grains  White bread. Pasta made with refined flour. White rice.  Vegetables  Fried potatoes. Canned vegetables. Well-cooked vegetables.   Fruits  Fruit juice. Cooked, strained fruit.  Meats and Other Protein Sources  Fatty cuts of meat. Fried poultry or fried fish.  Dairy  Milk. Yogurt. Cream cheese. Sour cream.  Beverages  Soft drinks.  Other  Cakes and pastries. Butter and oils.  The items listed above may not be a complete list of foods and beverages to avoid. Contact your dietitian for more information.  WHAT ARE SOME TIPS FOR INCLUDING HIGH-FIBER FOODS IN MY DIET?  · Eat a wide variety of high-fiber foods.  · Make sure that half of all grains consumed each day are whole grains.  · Replace breads and cereals made from refined flour or white flour with whole-grain breads and cereals.  · Replace white rice with brown rice, bulgur wheat, or millet.  · Start the day with a breakfast that is high in fiber, such as a   cereal that contains at least 5 grams of fiber per serving.  · Use beans in place of meat in soups, salads, or pasta.  · Eat high-fiber snacks, such as berries, raw vegetables, nuts, or popcorn.     This information is not intended to replace advice given to you by your health care provider. Make sure you discuss any questions you have with your health care provider.     Document Released: 11/21/2005 Document Revised: 12/12/2014 Document Reviewed: 05/06/2014  Elsevier Interactive Patient Education ©2016 Elsevier  Inc.

## 2016-10-13 ENCOUNTER — Telehealth: Payer: Self-pay | Admitting: Internal Medicine

## 2016-10-13 NOTE — Telephone Encounter (Signed)
I spoke with Mrs Dewing and pt already has appt scheduled to see Dr Silvio Pate on 10/18/16 at 12:30. Mrs Canterberry said pt has had problems on and off for long time with diarrhea and wants pt tested for c diff to see if that is causing the problem; pt does not have diarrhea today. Mr Masley only wants to see Dr Silvio Pate. Mrs Fredieu will cb if pt needs to be seen before 10/18/16. FYI to Dr Silvio Pate.

## 2016-10-13 NOTE — Telephone Encounter (Signed)
Spoke to wife. She will discuss it more at the [facility]

## 2016-10-13 NOTE — Telephone Encounter (Signed)
Okay, Let her know that C diff testing is only recommended with ongoing daily diarrhea (not if it is intermittent) and the lab will only run the testing on liquidy stool

## 2016-10-13 NOTE — Telephone Encounter (Signed)
Patient Name: ADONI HEINIG  DOB: 1925-01-27    Initial Comment Caller States- husband has severe diarrhea   Nurse Assessment      Guidelines    Guideline Title Affirmed Question Affirmed Notes       Final Disposition User   FINAL ATTEMPT MADE - message left Hensel, RN, Aeriel

## 2016-10-17 ENCOUNTER — Ambulatory Visit: Payer: Self-pay | Admitting: Family Medicine

## 2016-10-18 ENCOUNTER — Ambulatory Visit (INDEPENDENT_AMBULATORY_CARE_PROVIDER_SITE_OTHER): Payer: Medicare Other | Admitting: Internal Medicine

## 2016-10-18 ENCOUNTER — Encounter: Payer: Self-pay | Admitting: Internal Medicine

## 2016-10-18 VITALS — BP 132/90 | HR 60 | Temp 97.4°F | Wt 165.0 lb

## 2016-10-18 DIAGNOSIS — K529 Noninfective gastroenteritis and colitis, unspecified: Secondary | ICD-10-CM | POA: Diagnosis not present

## 2016-10-18 DIAGNOSIS — Z23 Encounter for immunization: Secondary | ICD-10-CM

## 2016-10-18 NOTE — Patient Instructions (Signed)
Please try over the counter loperamide-- 2mg -- with the first loose stool. You can repeat this once for a maximum of 2 daily. Let me know if this doesn't help

## 2016-10-18 NOTE — Assessment & Plan Note (Addendum)
Seems to be functional No pain or signs of IBS/IBD Appetite is good Not infectious by history No dairy or sweeteners Will have him try loperamide on his bad days Would try cholestyramine if that doesn't work

## 2016-10-18 NOTE — Progress Notes (Signed)
Subjective:    Patient ID: Bryan David, male    DOB: 1925/09/04, 82 y.o.   MRN: [id number]  HPI Here with wife--worsened problems with loose stools  Very variable May go 5-7 times a day---and loose (stool and then colored water). Mostly in afternoon and before dark Will spend extended times on the commode Not much at night No blood Does not usually happen on consecutive days May go 2-3 weeks without an episode---but more often lately (in general)  Appetite is good Avoid some food--afraid it will bring on the loose stools (but doesn't have anything he can relate to the bad days) Collards did seem to cause problems Weight is about the same  Some milk in cereal--but not much dairy otherwise No sugar free products  Didn't try fiber supplement Tried loperamide once-- stomach felt full (but not from the med apparently)  Current Outpatient Prescriptions on File Prior to Visit  Medication Sig Dispense Refill  . aspirin EC 81 MG tablet Take 81 mg by mouth 2 (two) times daily as needed.     . metoprolol tartrate (LOPRESSOR) 25 MG tablet Take 0.5 tablets (12.5 mg total) by mouth 2 (two) times daily. 90 tablet 3   No current facility-administered medications on file prior to visit.     Allergies  Allergen Reactions  . Acetaminophen Other (See Comments)    "made him feel silly"    Past Medical History:  Diagnosis Date  . Anxiety   . Arthritis   . Biliary acute pancreatitis   . Bladder cancer ([place])   . Carpal tunnel syndrome   . Choledocholithiasis   . COPD (chronic obstructive pulmonary disease) ([place])   . Diverticulosis   . Esophageal stricture   . GERD (gastroesophageal reflux disease)   . Hiatal hernia   . Hypertension   . Mood disorder ([place]) 06/08/2016  . Osteoarthritis   . PE (pulmonary embolism)   . Pneumonia   . Pulmonary embolus ([place])   . Renal cancer ([place])   . Shortness of breath   . Urinary frequency   . Vitamin B12 deficiency     Past Surgical  History:  Procedure Laterality Date  . CARPAL TUNNEL RELEASE  12/11   Right---Dr Burney Gauze  . CHOLECYSTECTOMY    . CYSTOSTOMY W/ BLADDER BIOPSY     multiple  . ESOPHAGOGASTRODUODENOSCOPY    . [place]   right  . PARTIAL NEPHRECTOMY    . TRANSURETHRAL RESECTION OF PROSTATE     limited    Family History  Problem Relation Age of Onset  . Hypertension Mother   . Heart failure Mother   . Stroke Mother   . Colon cancer Neg Hx   . Stroke Brother   . Heart attack Brother   . Diabetes Sister   . Stroke Sister   . Lymphoma Son     Social History   Social History  . Marital status: Married    Spouse name: N/A  . Number of children: 2  . Years of education: N/A   Occupational History  . retired - Systems developer, still does some produce Retired   Social History Main Topics  . Smoking status: Former Smoker    Packs/day: 1.00    Years: 40.00    Types: Cigarettes    Quit date: 06/25/1981  . Smokeless tobacco: Never Used  . Alcohol use No  . Drug use: No  . Sexual activity: Not on file   Other Topics Concern  .  Not on file   Social History Narrative   Has living will.   Asks for wife as health care POA.   Requests DNR--done 06/08/16   Not sure about feeding tube               Review of Systems  No abdominal pain --even before his spells No cough or SOB Voids okay     Objective:   Physical Exam  Constitutional: He appears well-developed. No distress.  Pulmonary/Chest: Effort normal and breath sounds normal. No respiratory distress. He has no wheezes. He has no rales.  Abdominal: Soft. Bowel sounds are normal. He exhibits no distension. There is no tenderness. There is no rebound and no guarding.  Genitourinary:  Genitourinary Comments: Right inguinal hernia --not into scrotum. It is reducible  Musculoskeletal: He exhibits no edema.          Assessment & Plan:

## 2016-10-24 ENCOUNTER — Encounter (HOSPITAL_COMMUNITY): Payer: Self-pay

## 2016-10-24 ENCOUNTER — Emergency Department (HOSPITAL_COMMUNITY)
Admission: EM | Admit: 2016-10-24 | Discharge: 2016-10-24 | Disposition: A | Discharge status: Home / Self Care | Payer: Medicare Other | Attending: Emergency Medicine | Admitting: Emergency Medicine

## 2016-10-24 DIAGNOSIS — R197 Diarrhea, unspecified: Secondary | ICD-10-CM | POA: Diagnosis not present

## 2016-10-24 DIAGNOSIS — Z7982 Long term (current) use of aspirin: Secondary | ICD-10-CM | POA: Diagnosis not present

## 2016-10-24 DIAGNOSIS — I10 Essential (primary) hypertension: Secondary | ICD-10-CM | POA: Diagnosis not present

## 2016-10-24 DIAGNOSIS — K4090 Unilateral inguinal hernia, without obstruction or gangrene, not specified as recurrent: Secondary | ICD-10-CM | POA: Diagnosis not present

## 2016-10-24 DIAGNOSIS — Z8551 Personal history of malignant neoplasm of bladder: Secondary | ICD-10-CM | POA: Diagnosis not present

## 2016-10-24 DIAGNOSIS — Z87891 Personal history of nicotine dependence: Secondary | ICD-10-CM | POA: Insufficient documentation

## 2016-10-24 DIAGNOSIS — J449 Chronic obstructive pulmonary disease, unspecified: Secondary | ICD-10-CM | POA: Diagnosis not present

## 2016-10-24 LAB — CBC WITH DIFFERENTIAL/PLATELET
BASOS PCT: 0 %
Basophils Absolute: 0 10*3/uL (ref 0.0–0.1)
EOS ABS: 0.1 10*3/uL (ref 0.0–0.7)
Eosinophils Relative: 1 %
HEMATOCRIT: 47.9 % (ref 39.0–52.0)
HEMOGLOBIN: 15.9 g/dL (ref 13.0–17.0)
LYMPHS ABS: 1 10*3/uL (ref 0.7–4.0)
Lymphocytes Relative: 10 %
MCH: 29.4 pg (ref 26.0–34.0)
MCHC: 33.2 g/dL (ref 30.0–36.0)
MCV: 88.5 fL (ref 78.0–100.0)
MONO ABS: 0.8 10*3/uL (ref 0.1–1.0)
MONOS PCT: 8 %
Neutro Abs: 8.7 10*3/uL — ABNORMAL HIGH (ref 1.7–7.7)
Neutrophils Relative %: 81 %
Platelets: 144 10*3/uL — ABNORMAL LOW (ref 150–400)
RBC: 5.41 MIL/uL (ref 4.22–5.81)
RDW: 13.3 % (ref 11.5–15.5)
WBC: 10.7 10*3/uL — ABNORMAL HIGH (ref 4.0–10.5)

## 2016-10-24 LAB — BASIC METABOLIC PANEL
Anion gap: 9 (ref 5–15)
BUN: 11 mg/dL (ref 6–20)
CALCIUM: 9.8 mg/dL (ref 8.9–10.3)
CHLORIDE: 104 mmol/L (ref 101–111)
CO2: 28 mmol/L (ref 22–32)
CREATININE: 0.94 mg/dL (ref 0.61–1.24)
GFR calc Af Amer: >60 mL/min (ref >60)
GFR calc non Af Amer: >60 mL/min (ref >60)
GLUCOSE: 114 mg/dL — AB (ref 65–99)
Potassium: 4 mmol/L (ref 3.5–5.1)
Sodium: 141 mmol/L (ref 135–145)

## 2016-10-24 LAB — I-STAT CG4 LACTIC ACID, ED: Lactic Acid, Venous: 0.96 mmol/L (ref 0.5–1.9)

## 2016-10-24 MED ORDER — OXYCODONE HCL 5 MG PO TABS: 5.0000 mg | ORAL_TABLET | ORAL | 0 refills | Status: DC | PRN

## 2016-10-24 MED ORDER — CHOLESTYRAMINE 4 G PO PACK: 4.0000 g | PACK | Freq: Three times a day (TID) | ORAL | 12 refills | Status: DC

## 2016-10-24 NOTE — ED Triage Notes (Signed)
Pt complaining of increasing pain and swelling to R inguinal hernia. Pt states some diarrhea. Pt states increased pain with standing.

## 2016-10-24 NOTE — ED Provider Notes (Signed)
[facility] DEPT Provider Note   CSN: [id number] Arrival date & time: 10/24/16  1649     History   Chief Complaint Chief Complaint  Patient presents with  . Inguinal Hernia    HPI Bryan David is a 82 y.o. male.  82 year old male comes in because of a right inguinal hernia. It has been present at least 6 months, but seems to be getting larger and it is painful. It hurts more when he stands up. He denies nausea, vomiting. He has chronic diarrhea and has had bad diarrhea today. He had seen his PCP who advised he take loperamide, but he is afraid he will get constipated from it. He has not seen a surgeon regarding the hernia.   The history is provided by the patient.    Past Medical History:  Diagnosis Date  . Anxiety   . Arthritis   . Biliary acute pancreatitis   . Bladder cancer ([place])   . Carpal tunnel syndrome   . Choledocholithiasis   . COPD (chronic obstructive pulmonary disease) ([place])   . Diverticulosis   . Esophageal stricture   . GERD (gastroesophageal reflux disease)   . Hiatal hernia   . Hypertension   . Mood disorder ([place]) 06/08/2016  . Osteoarthritis   . PE (pulmonary embolism)   . Pneumonia   . Pulmonary embolus ([place])   . Renal cancer ([place])   . Shortness of breath   . Urinary frequency   . Vitamin B12 deficiency     Patient Active Problem List   Diagnosis Date Noted  . Chronic diarrhea 10/18/2016  . Aortic sclerosis 06/08/2016  . Mood disorder ([place]) 06/08/2016  . Advance directive discussed with patient 06/08/2016  . Thrombocytopenia ([place]) 05/20/2015  . Routine general medical examination at a health care facility 05/13/2014  . Atrial tachycardia ([place]) 06/27/2012  . GERD (gastroesophageal reflux disease) 09/23/2011  . Raynaud phenomenon 09/23/2011  . Osteoarthritis, multiple sites 09/23/2011  . COPD (chronic obstructive pulmonary disease) ([place]) 04/27/2011  . Essential hypertension, benign 10/07/2009  . VITAMIN B12 DEFICIENCY 05/22/2009  .  ESOPHAGEAL STRICTURE 05/21/2009  . BLADDER CANCER 12/14/2007  . KIDNEY CANCER 12/14/2007    Past Surgical History:  Procedure Laterality Date  . CARPAL TUNNEL RELEASE  12/11   Right---Dr Burney Gauze  . CHOLECYSTECTOMY    . CYSTOSTOMY W/ BLADDER BIOPSY     multiple  . ESOPHAGOGASTRODUODENOSCOPY    . [place]   right  . PARTIAL NEPHRECTOMY    . TRANSURETHRAL RESECTION OF PROSTATE     limited       Home Medications    Prior to Admission medications   Medication Sig Start Date End Date Taking? Authorizing Provider  aspirin EC 81 MG tablet Take 81 mg by mouth 2 (two) times daily as needed.    Yes Historical Provider, MD  loperamide (IMODIUM) 2 MG capsule Take 2 mg by mouth daily as needed for diarrhea or loose stools.   Yes Historical Provider, MD  metoprolol tartrate (LOPRESSOR) 25 MG tablet Take 0.5 tablets (12.5 mg total) by mouth 2 (two) times daily. 06/08/16  Yes Venia Carbon, MD    Family History Family History  Problem Relation Age of Onset  . Hypertension Mother   . Heart failure Mother   . Stroke Mother   . Lymphoma Son   . Stroke Brother   . Heart attack Brother   . Diabetes Sister   . Stroke Sister   . Colon cancer Neg  Hx     Social History Social History  Substance Use Topics  . Smoking status: Former Smoker    Packs/day: 1.00    Years: 40.00    Types: Cigarettes    Quit date: 06/25/1981  . Smokeless tobacco: Never Used  . Alcohol use No     Allergies   Acetaminophen   Review of Systems Review of Systems  All other systems reviewed and are negative.    Physical Exam Updated Vital Signs BP 133/73 (BP Location: Right Arm)   Pulse 83   Temp 98.5 F (36.9 C) (Oral)   Resp 16   SpO2 98%   Physical Exam  Nursing note and vitals reviewed.  82 year old male, resting comfortably and in no acute distress. Vital signs are Normal. Oxygen saturation is 98%, which is normal. Head is normocephalic and atraumatic. PERRLA,  EOMI. Oropharynx is clear. Neck is nontender and supple without adenopathy or JVD. Back is nontender and there is no CVA tenderness. Lungs are clear without rales, wheezes, or rhonchi. Chest is nontender. Heart has regular rate and rhythm with 3/6 harsh systolic murmur best heard at the apex. Abdomen is soft, flat, without masses or hepatosplenomegaly and peristalsis is normoactive. Right inguinal hernia is present. This appears to be a direct hernia with moderate tenderness. However, it is soft and easily reducible. Extremities have no cyanosis or edema, full range of motion is present. Skin is warm and dry without rash. Neurologic: Mental status is normal, cranial nerves are intact, there are no motor or sensory deficits.  ED Treatments / Results  Labs (all labs ordered are listed, but only abnormal results are displayed) Labs Reviewed  BASIC METABOLIC PANEL - Abnormal; Notable for the following:       Result Value   Glucose, Bld 114 (*)    All other components within normal limits  CBC WITH DIFFERENTIAL/PLATELET - Abnormal; Notable for the following:    WBC 10.7 (*)    Platelets 144 (*)    Neutro Abs 8.7 (*)    All other components within normal limits  I-STAT CG4 LACTIC ACID, ED    Procedures Procedures (including critical care time)  Medications Ordered in ED Medications - No data to display   Initial Impression / Assessment and Plan / ED Course  I have reviewed the triage vital signs and the nursing notes.  Pertinent labs & imaging results that were available during my care of the patient were reviewed by me and considered in my medical decision making (see chart for details).  Clinical Course    Right inguinal hernia with no clinical signs of incarceration or strangulation. We'll check screening labs including lactic acid. If normal, will refer to general surgery for an opinion. Regarding diarrhea, review of old records shows this is an ongoing problem. Will try  cholestyramine.  Lactic acid level is come back normal.. Discussed at length with the patient surgical option versus dealing with presence of the hernia. Return precautions are given and he is sent home with a prescription for a small number of oxycodone pills as well as prescription for cholestyramine to try to treat his diarrhea. Referral is given to general surgery.  Final Clinical Impressions(s) / ED Diagnoses   Final diagnoses:  Right inguinal hernia  Diarrhea, unspecified type    New Prescriptions New Prescriptions   CHOLESTYRAMINE (QUESTRAN) 4 G PACKET    Take 1 packet (4 g total) by mouth 3 (three) times daily with meals.   OXYCODONE (  ROXICODONE) 5 MG IMMEDIATE RELEASE TABLET    Take 1 tablet (5 mg total) by mouth every 4 (four) hours as needed for severe pain.     Delora Fuel, MD [id number]

## 2016-10-24 NOTE — Discharge Instructions (Signed)
Return if painis getting worse, or if you start vomiting.

## 2016-10-25 ENCOUNTER — Telehealth: Payer: Self-pay

## 2016-10-25 NOTE — Telephone Encounter (Signed)
Left message to call office to f/u on recent ER visit

## 2017-06-14 ENCOUNTER — Encounter: Payer: Self-pay | Admitting: Internal Medicine

## 2017-06-14 ENCOUNTER — Ambulatory Visit (INDEPENDENT_AMBULATORY_CARE_PROVIDER_SITE_OTHER): Payer: Medicare Other | Admitting: Internal Medicine

## 2017-06-14 VITALS — BP 138/76 | HR 56 | Temp 97.5°F | Ht 68.0 in | Wt 156.0 lb

## 2017-06-14 DIAGNOSIS — I10 Essential (primary) hypertension: Secondary | ICD-10-CM | POA: Diagnosis not present

## 2017-06-14 DIAGNOSIS — Z0000 Encounter for general adult medical examination without abnormal findings: Secondary | ICD-10-CM

## 2017-06-14 DIAGNOSIS — Z7189 Other specified counseling: Secondary | ICD-10-CM

## 2017-06-14 DIAGNOSIS — I471 Supraventricular tachycardia: Secondary | ICD-10-CM | POA: Diagnosis not present

## 2017-06-14 DIAGNOSIS — K529 Noninfective gastroenteritis and colitis, unspecified: Secondary | ICD-10-CM

## 2017-06-14 DIAGNOSIS — I350 Nonrheumatic aortic (valve) stenosis: Secondary | ICD-10-CM | POA: Diagnosis not present

## 2017-06-14 DIAGNOSIS — F39 Unspecified mood [affective] disorder: Secondary | ICD-10-CM

## 2017-06-14 DIAGNOSIS — Z23 Encounter for immunization: Secondary | ICD-10-CM | POA: Diagnosis not present

## 2017-06-14 DIAGNOSIS — D696 Thrombocytopenia, unspecified: Secondary | ICD-10-CM | POA: Diagnosis not present

## 2017-06-14 DIAGNOSIS — I4719 Other supraventricular tachycardia: Secondary | ICD-10-CM

## 2017-06-14 LAB — CBC WITH DIFFERENTIAL/PLATELET
BASOS ABS: 0 10*3/uL (ref 0.0–0.1)
Basophils Relative: 0.6 % (ref 0.0–3.0)
EOS ABS: 0.3 10*3/uL (ref 0.0–0.7)
Eosinophils Relative: 4.2 % (ref 0.0–5.0)
HCT: 43.3 % (ref 39.0–52.0)
Hemoglobin: 14.4 g/dL (ref 13.0–17.0)
LYMPHS ABS: 1.3 10*3/uL (ref 0.7–4.0)
Lymphocytes Relative: 18.4 % (ref 12.0–46.0)
MCHC: 33.2 g/dL (ref 30.0–36.0)
MCV: 87.2 fl (ref 78.0–100.0)
Monocytes Absolute: 0.6 10*3/uL (ref 0.1–1.0)
Monocytes Relative: 8.8 % (ref 3.0–12.0)
NEUTROS ABS: 5 10*3/uL (ref 1.4–7.7)
NEUTROS PCT: 68 % (ref 43.0–77.0)
PLATELETS: 127 10*3/uL — AB (ref 150.0–400.0)
RBC: 4.96 Mil/uL (ref 4.22–5.81)
RDW: 14.4 % (ref 11.5–15.5)
WBC: 7.3 10*3/uL (ref 4.0–10.5)

## 2017-06-14 LAB — COMPREHENSIVE METABOLIC PANEL
ALT: 11 U/L (ref 0–53)
AST: 17 U/L (ref 0–37)
Albumin: 4.3 g/dL (ref 3.5–5.2)
Alkaline Phosphatase: 74 U/L (ref 39–117)
BILIRUBIN TOTAL: 0.8 mg/dL (ref 0.2–1.2)
BUN: 15 mg/dL (ref 6–23)
CALCIUM: 9.9 mg/dL (ref 8.4–10.5)
CO2: 30 meq/L (ref 19–32)
CREATININE: 0.93 mg/dL (ref 0.40–1.50)
Chloride: 104 mEq/L (ref 96–112)
GFR: 81.2 mL/min (ref >60.00)
Glucose, Bld: 105 mg/dL — ABNORMAL HIGH (ref 70–99)
Potassium: 4.2 mEq/L (ref 3.5–5.1)
Sodium: 142 mEq/L (ref 135–145)
Total Protein: 7.6 g/dL (ref 6.0–8.3)

## 2017-06-14 LAB — T4, FREE: Free T4: 0.72 ng/dL (ref 0.60–1.60)

## 2017-06-14 MED ORDER — METOPROLOL TARTRATE 25 MG PO TABS: 12.5000 mg | ORAL_TABLET | Freq: Two times a day (BID) | ORAL | 3 refills | Status: DC

## 2017-06-14 NOTE — Addendum Note (Signed)
Addended by: Pilar Grammes on: 06/14/2017 12:04 PM   Modules accepted: Orders

## 2017-06-14 NOTE — Assessment & Plan Note (Signed)
Chronic with easy bruising Will recheck

## 2017-06-14 NOTE — Assessment & Plan Note (Signed)
Better Discussed imodium before going out

## 2017-06-14 NOTE — Progress Notes (Signed)
Subjective:    Patient ID: Bryan David, male    DOB: Oct 11, 1925, 83 y.o.   MRN: [id number]  HPI Here with wife for Medicare wellness visit and follow up of chronic health conditions Reviewed form and advanced directives Reviewed other doctors-- no others now No alcohol or tobacco Tries to stay active in his garden No falls Vision poor-- okay on left Poor hearing--doesn't want aides No depression or anhedonia Independent with instrumental ADLs No sig memory issues  Diarrhea is variable but much better Never tried the cholestyramine Did try imodium--part dose (discussed using before going out) No blood in stool  Done with Dr Risa Grill Years out from the bladder cancer  No regular cough Breathing is okay Still spits up a lot of phlegm Did quit smoking after 41 years (and no longer chews)  No chest pain No SOB No palpitations No dizziness or syncope Some edema---and will get leg cramps in cold weather  No abnormal bleeding Bruises very easily still No mouth or urine bleeding  Still voices how hard it has been to lose his 11 year old son 2 years ago Was unmarried and no kids  Current Outpatient Prescriptions on File Prior to Visit  Medication Sig Dispense Refill  . aspirin EC 81 MG tablet Take 81 mg by mouth 2 (two) times daily as needed.     . metoprolol tartrate (LOPRESSOR) 25 MG tablet Take 0.5 tablets (12.5 mg total) by mouth 2 (two) times daily. 90 tablet 3   No current facility-administered medications on file prior to visit.     Allergies  Allergen Reactions  . Acetaminophen Other (See Comments)    "made him feel silly"    Past Medical History:  Diagnosis Date  . Anxiety   . Arthritis   . Biliary acute pancreatitis   . Bladder cancer ([place])   . Carpal tunnel syndrome   . Choledocholithiasis   . COPD (chronic obstructive pulmonary disease) ([place])   . Diverticulosis   . Esophageal stricture   . GERD (gastroesophageal reflux disease)   . Hiatal  hernia   . Hypertension   . Mood disorder ([place]) 06/08/2016  . Osteoarthritis   . PE (pulmonary embolism)   . Pneumonia   . Pulmonary embolus ([place])   . Renal cancer ([place])   . Shortness of breath   . Urinary frequency   . Vitamin B12 deficiency     Past Surgical History:  Procedure Laterality Date  . CARPAL TUNNEL RELEASE  12/11   Right---Dr Burney Gauze  . CHOLECYSTECTOMY    . CYSTOSTOMY W/ BLADDER BIOPSY     multiple  . ESOPHAGOGASTRODUODENOSCOPY    . [place]   right  . PARTIAL NEPHRECTOMY    . TRANSURETHRAL RESECTION OF PROSTATE     limited    Family History  Problem Relation Age of Onset  . Hypertension Mother   . Heart failure Mother   . Stroke Mother   . Lymphoma Son   . Stroke Brother   . Heart attack Brother   . Diabetes Sister   . Stroke Sister   . Colon cancer Neg Hx     Social History   Social History  . Marital status: Married    Spouse name: N/A  . Number of children: 2  . Years of education: N/A   Occupational History  . retired - Systems developer, still does some produce Retired   Social History Main Topics  . Smoking status: Former Smoker  Packs/day: 1.00    Years: 40.00    Types: Cigarettes    Quit date: 06/25/1981  . Smokeless tobacco: Never Used  . Alcohol use No  . Drug use: No  . Sexual activity: Not on file   Other Topics Concern  . Not on file   Social History Narrative   Has living will.   Asks for wife as health care POA.   Requests DNR--done 06/08/16   Not sure about feeding tube               Review of Systems Appetite is fine Weight is down again--usually from all his work in the garden Some back pain--depends on what he does Upper denture only--doing okay Sleeps well No skin problems Wears seat belt Some pain in hands also--esp thumb and next 2 fingers bilaterally    Objective:   Physical Exam  Constitutional: He is oriented to person, place, and time. No distress.  HENT:  Mouth/Throat:  Oropharynx is clear and moist. No oropharyngeal exudate.  Neck: No thyromegaly present.  Cardiovascular: Normal rate and regular rhythm.   Feet warm but no palpable pulses Gr 3/6 aortic systolic murmur Frequent extra beats  Pulmonary/Chest: Effort normal. No respiratory distress. He has no wheezes. He has no rales.  Decreased breath sounds but clear  Abdominal: Soft. There is no tenderness.  Musculoskeletal: He exhibits no edema or tenderness.  Thickening in PIPs in hands especially  Lymphadenopathy:    He has no cervical adenopathy.  Neurological: He is alert and oriented to person, place, and time.  President-- "Daisy Floro, Obama, Clinton--- ?" [phone] D-l-o-w Recall 3/3  Skin:  Large keratotic lesion on dorsum of left hand (discussed having it evaluated and he doesn't want to) Scaly areas on plantar feet  Psychiatric: He has a normal mood and affect. His behavior is normal.          Assessment & Plan:

## 2017-06-14 NOTE — Assessment & Plan Note (Signed)
BP Readings from Last 3 Encounters:  06/14/17 138/76  10/24/16 144/76  10/18/16 132/90   Good control

## 2017-06-14 NOTE — Assessment & Plan Note (Signed)
I have personally reviewed the Medicare Annual Wellness questionnaire and have noted 1. The patient's medical and social history 2. Their use of alcohol, tobacco or illicit drugs 3. Their current medications and supplements 4. The patient's functional ability including ADL's, fall risks, home safety risks and hearing or visual             impairment. 5. Diet and physical activities 6. Evidence for depression or mood disorders  The patients weight, height, BMI and visual acuity have been recorded in the chart I have made referrals, counseling and provided education to the patient based review of the above and I have provided the pt with a written personalized care plan for preventive services.  I have provided you with a copy of your personalized plan for preventive services. Please take the time to review along with your updated medication list.  Update pneumovax today Yearly flu vaccine No cancer screening due to age No more surveillance for bladder cancer history Unwilling to see derm for the hand lesion --but recommended

## 2017-06-14 NOTE — Assessment & Plan Note (Signed)
No symptomatic recurrence Continue the beta blocker

## 2017-06-14 NOTE — Assessment & Plan Note (Signed)
Has DNR 

## 2017-06-14 NOTE — Assessment & Plan Note (Signed)
Classic murmur but no symptoms No action

## 2017-06-14 NOTE — Assessment & Plan Note (Signed)
Still grieves son but no Rx indicated

## 2017-06-20 ENCOUNTER — Encounter: Payer: Medicare Other | Admitting: Internal Medicine

## 2017-10-17 ENCOUNTER — Telehealth: Payer: Self-pay | Admitting: *Deleted

## 2017-10-17 NOTE — Telephone Encounter (Signed)
Okay I will discuss how he is doing and try to find out about the stools without giving wife away

## 2017-10-17 NOTE — Telephone Encounter (Signed)
Copied from [place] [phone]. Topic: Inquiry >> Oct 17, 2017  9:29 AM Bea Graff, NT wrote: Reason for CRM: Patients wife called and scheduled him for an appt this Friday because she is concerned about her husband having diarrhea and vomiting. She wants Dr. Silvio Pate to know the pt did not want to come in and she would really like for him to not know that she made this appt. (the pt already knows he had an appt in November, but he doesn't know what it was for). She states he is almost 83 years old and thinks that since he is getting older he doesn't need to see doctors but she is concerned about him and wants him to feel better, but that he would get upset if she knows she made this appt.

## 2017-10-20 ENCOUNTER — Telehealth: Payer: Self-pay

## 2017-10-20 ENCOUNTER — Ambulatory Visit: Payer: Medicare Other | Admitting: Internal Medicine

## 2017-10-20 NOTE — Telephone Encounter (Signed)
Appt cancelled this morning for appt with Dr Silvio Pate at 7:15. Do you want to charge a late cancellation fee and should pt be rescheduled?

## 2017-10-20 NOTE — Telephone Encounter (Signed)
No charge but should be rescheduled--wife had significant concerns

## 2017-10-20 NOTE — Telephone Encounter (Signed)
Copied from [place] [id number]. Topic: Quick Communication - Appointment Cancellation >> Oct 20, 2017  6:56 AM Pricilla Handler wrote: Patient's wife called to cancel Patient's appointment scheduled for today, Friday 10/20/2017 at 7:15am with Dr. Silvio Pate. Patient has not rescheduled their appointment. Patient's wife called and stated that Patient's Diarrhea is so bad and frequent, that he will not be able to attend the appt with Dr. Silvio Pate this morning.    Route to department's PEC pool.

## 2017-10-20 NOTE — Telephone Encounter (Signed)
Left a message for them to call. Added to [place].

## 2017-10-23 NOTE — Telephone Encounter (Signed)
Spoke to  pt's wife .

## 2017-10-23 NOTE — Telephone Encounter (Signed)
He should try to take the loperamide as infrequently as possible because it does lead to constipation then. Can review at OV next week

## 2017-10-23 NOTE — Telephone Encounter (Signed)
Pt's wife called back I didn't see anything available this week and she would like Shannon to call her back.

## 2017-10-23 NOTE — Telephone Encounter (Signed)
Left another message to call and schedule appt.

## 2017-10-23 NOTE — Telephone Encounter (Signed)
Spoke to pt's wife. He still has intermittent diarrhea and constipation. It is not as bad. He is taking loprimide for the diarrhea then it stops him up. Seems to be a little better since she called last week for the appointment. He has an appt 10-30-17.

## 2017-10-30 ENCOUNTER — Encounter: Payer: Self-pay | Admitting: Internal Medicine

## 2017-10-30 ENCOUNTER — Ambulatory Visit (INDEPENDENT_AMBULATORY_CARE_PROVIDER_SITE_OTHER): Payer: Medicare Other | Admitting: Internal Medicine

## 2017-10-30 VITALS — BP 122/70 | HR 60 | Wt 157.0 lb

## 2017-10-30 DIAGNOSIS — Z23 Encounter for immunization: Secondary | ICD-10-CM

## 2017-10-30 DIAGNOSIS — K529 Noninfective gastroenteritis and colitis, unspecified: Secondary | ICD-10-CM | POA: Diagnosis not present

## 2017-10-30 NOTE — Assessment & Plan Note (Signed)
I am concerned about his imodium use Was taking daily but has cut it down (and cutting into 3 pieces) Discussed that constipation and impaction can cause loose stools but he refused x-ray to check for this Urged him to limit the imodium

## 2017-10-30 NOTE — Progress Notes (Signed)
Subjective:    Patient ID: Bryan David, male    DOB: 02-10-1925, 83 y.o.   MRN: [id number]  HPI Here with wife due to worsened diarrhea Highly variable--may go 2-3 days without a problem, then open up He still uses the imodium--- was taking it every day for a while (though cutting them in half).  He then cut them into 3 pieces due to constipation  Discussed concern with secondary constipation  No blood Wife felt his stomach was full at times--?from the loperamide  Current Outpatient Medications on File Prior to Visit  Medication Sig Dispense Refill  . aspirin EC 81 MG tablet Take 81 mg by mouth 2 (two) times daily as needed.     . loperamide (IMODIUM A-D) 2 MG tablet Take 2 mg by mouth as needed for diarrhea or loose stools.    Marland Kitchen loratadine (CLARITIN) 10 MG tablet Take 10 mg by mouth daily as needed for allergies.    . metoprolol tartrate (LOPRESSOR) 25 MG tablet Take 0.5 tablets (12.5 mg total) by mouth 2 (two) times daily. 90 tablet 3  . vitamin B-12 (CYANOCOBALAMIN) 500 MCG tablet Take 500 mcg by mouth 2 (two) times daily.     No current facility-administered medications on file prior to visit.     Allergies  Allergen Reactions  . Acetaminophen Other (See Comments)    "made him feel silly"    Past Medical History:  Diagnosis Date  . Anxiety   . Arthritis   . Biliary acute pancreatitis   . Bladder cancer ([place])   . Carpal tunnel syndrome   . Choledocholithiasis   . COPD (chronic obstructive pulmonary disease) ([place])   . Diverticulosis   . Esophageal stricture   . GERD (gastroesophageal reflux disease)   . Hiatal hernia   . Hypertension   . Mood disorder ([place]) 06/08/2016  . Osteoarthritis   . PE (pulmonary embolism)   . Pneumonia   . Pulmonary embolus ([place])   . Renal cancer ([place])   . Shortness of breath   . Urinary frequency   . Vitamin B12 deficiency     Past Surgical History:  Procedure Laterality Date  . CARPAL TUNNEL RELEASE  12/11   Right---Dr  Burney Gauze  . CHOLECYSTECTOMY    . CYSTOSTOMY W/ BLADDER BIOPSY     multiple  . ESOPHAGOGASTRODUODENOSCOPY    . [place]   right  . PARTIAL NEPHRECTOMY    . TRANSURETHRAL RESECTION OF PROSTATE     limited    Family History  Problem Relation Age of Onset  . Hypertension Mother   . Heart failure Mother   . Stroke Mother   . Lymphoma Son   . Stroke Brother   . Heart attack Brother   . Diabetes Sister   . Stroke Sister   . Colon cancer Neg Hx     Social History   Socioeconomic History  . Marital status: Married    Spouse name: Not on file  . Number of children: 2  . Years of education: Not on file  . Highest education level: Not on file  Social Needs  . Financial resource strain: Not on file  . Food insecurity - worry: Not on file  . Food insecurity - inability: Not on file  . Transportation needs - medical: Not on file  . Transportation needs - non-medical: Not on file  Occupational History  . Occupation: retired - Systems developer, still does some produce  Employer: RETIRED  Tobacco Use  . Smoking status: Former Smoker    Packs/day: 1.00    Years: 40.00    Pack years: 40.00    Types: Cigarettes    Last attempt to quit: 06/25/1981    Years since quitting: 36.3  . Smokeless tobacco: Never Used  Substance and Sexual Activity  . Alcohol use: No  . Drug use: No  . Sexual activity: Not on file  Other Topics Concern  . Not on file  Social History Narrative   Has living will.   Asks for wife as health care POA.-- then daughter June Summons   Requests DNR--done 06/08/16   Not sure about feeding tube            Review of Systems  Appetite ---"I eat like a horse" Weight is down a few pounds      Objective:   Physical Exam  Pulmonary/Chest: Effort normal and breath sounds normal. No respiratory distress. He has no wheezes. He has no rales.  Abdominal: Soft. Bowel sounds are normal. He exhibits no distension. There is no tenderness. There is  no rebound and no guarding.  Psychiatric:  Cantankerous and argumentative Refused x-ray "this is my last trip here"          Assessment & Plan:

## 2017-10-30 NOTE — Addendum Note (Signed)
Addended by: Pilar Grammes on: 10/30/2017 05:02 PM   Modules accepted: Orders

## 2017-11-20 ENCOUNTER — Encounter (HOSPITAL_COMMUNITY): Payer: Self-pay | Admitting: *Deleted

## 2017-11-20 ENCOUNTER — Emergency Department (HOSPITAL_COMMUNITY): Payer: Medicare Other

## 2017-11-20 ENCOUNTER — Emergency Department (HOSPITAL_COMMUNITY)
Admission: EM | Admit: 2017-11-20 | Discharge: 2017-11-20 | Disposition: A | Discharge status: Home / Self Care | Payer: Medicare Other | Attending: Emergency Medicine | Admitting: Emergency Medicine

## 2017-11-20 DIAGNOSIS — Z8551 Personal history of malignant neoplasm of bladder: Secondary | ICD-10-CM | POA: Insufficient documentation

## 2017-11-20 DIAGNOSIS — N2889 Other specified disorders of kidney and ureter: Secondary | ICD-10-CM

## 2017-11-20 DIAGNOSIS — Z85528 Personal history of other malignant neoplasm of kidney: Secondary | ICD-10-CM | POA: Diagnosis not present

## 2017-11-20 DIAGNOSIS — Z86711 Personal history of pulmonary embolism: Secondary | ICD-10-CM | POA: Diagnosis not present

## 2017-11-20 DIAGNOSIS — F0390 Unspecified dementia without behavioral disturbance: Secondary | ICD-10-CM | POA: Diagnosis not present

## 2017-11-20 DIAGNOSIS — K4090 Unilateral inguinal hernia, without obstruction or gangrene, not specified as recurrent: Secondary | ICD-10-CM

## 2017-11-20 DIAGNOSIS — I10 Essential (primary) hypertension: Secondary | ICD-10-CM | POA: Insufficient documentation

## 2017-11-20 DIAGNOSIS — R109 Unspecified abdominal pain: Secondary | ICD-10-CM | POA: Diagnosis not present

## 2017-11-20 DIAGNOSIS — Z87891 Personal history of nicotine dependence: Secondary | ICD-10-CM | POA: Insufficient documentation

## 2017-11-20 DIAGNOSIS — J449 Chronic obstructive pulmonary disease, unspecified: Secondary | ICD-10-CM | POA: Insufficient documentation

## 2017-11-20 DIAGNOSIS — R339 Retention of urine, unspecified: Secondary | ICD-10-CM | POA: Diagnosis not present

## 2017-11-20 DIAGNOSIS — Z7982 Long term (current) use of aspirin: Secondary | ICD-10-CM | POA: Diagnosis not present

## 2017-11-20 DIAGNOSIS — R102 Pelvic and perineal pain: Secondary | ICD-10-CM | POA: Diagnosis not present

## 2017-11-20 DIAGNOSIS — R197 Diarrhea, unspecified: Secondary | ICD-10-CM

## 2017-11-20 LAB — URINALYSIS, ROUTINE W REFLEX MICROSCOPIC
BACTERIA UA: NONE SEEN
BILIRUBIN URINE: NEGATIVE
Glucose, UA: NEGATIVE mg/dL
KETONES UR: NEGATIVE mg/dL
LEUKOCYTES UA: NEGATIVE
NITRITE: NEGATIVE
PROTEIN: 30 mg/dL — AB
Specific Gravity, Urine: 1.011 (ref 1.005–1.030)
pH: 5 (ref 5.0–8.0)

## 2017-11-20 LAB — COMPREHENSIVE METABOLIC PANEL
ALBUMIN: 4.6 g/dL (ref 3.5–5.0)
ALT: 14 U/L — ABNORMAL LOW (ref 17–63)
ANION GAP: 10 (ref 5–15)
AST: 29 U/L (ref 15–41)
Alkaline Phosphatase: 86 U/L (ref 38–126)
BILIRUBIN TOTAL: 1.5 mg/dL — AB (ref 0.3–1.2)
BUN: 17 mg/dL (ref 6–20)
CHLORIDE: 101 mmol/L (ref 101–111)
CO2: 27 mmol/L (ref 22–32)
Calcium: 9.5 mg/dL (ref 8.9–10.3)
Creatinine, Ser: 1.25 mg/dL — ABNORMAL HIGH (ref 0.61–1.24)
GFR calc Af Amer: 57 mL/min — ABNORMAL LOW (ref >60)
GFR calc non Af Amer: 49 mL/min — ABNORMAL LOW (ref >60)
Glucose, Bld: 127 mg/dL — ABNORMAL HIGH (ref 65–99)
POTASSIUM: 4.3 mmol/L (ref 3.5–5.1)
Sodium: 138 mmol/L (ref 135–145)
TOTAL PROTEIN: 7.5 g/dL (ref 6.5–8.1)

## 2017-11-20 LAB — CBC
HEMATOCRIT: 46.2 % (ref 39.0–52.0)
Hemoglobin: 15.4 g/dL (ref 13.0–17.0)
MCH: 29.4 pg (ref 26.0–34.0)
MCHC: 33.3 g/dL (ref 30.0–36.0)
MCV: 88.3 fL (ref 78.0–100.0)
Platelets: 137 10*3/uL — ABNORMAL LOW (ref 150–400)
RBC: 5.23 MIL/uL (ref 4.22–5.81)
RDW: 13.2 % (ref 11.5–15.5)
WBC: 12.9 10*3/uL — AB (ref 4.0–10.5)

## 2017-11-20 LAB — LIPASE, BLOOD: LIPASE: 24 U/L (ref 11–51)

## 2017-11-20 LAB — POC OCCULT BLOOD, ED: FECAL OCCULT BLD: NEGATIVE

## 2017-11-20 MED ORDER — SODIUM CHLORIDE 0.9 % IV BOLUS (SEPSIS)
1000.0000 mL | Freq: Once | INTRAVENOUS | Status: AC
  Administered 2017-11-20: 1000 mL via INTRAVENOUS

## 2017-11-20 MED ORDER — IOPAMIDOL (ISOVUE-300) INJECTION 61%
INTRAVENOUS | Status: AC
  Filled 2017-11-20: qty 100

## 2017-11-20 MED ORDER — ONDANSETRON HCL 4 MG/2ML IJ SOLN: 4.0000 mg | Freq: Once | INTRAMUSCULAR | Status: DC | PRN

## 2017-11-20 MED ORDER — IOPAMIDOL (ISOVUE-300) INJECTION 61%
80.0000 mL | Freq: Once | INTRAVENOUS | Status: AC | PRN
  Administered 2017-11-20: 80 mL via INTRAVENOUS

## 2017-11-20 NOTE — ED Notes (Signed)
Patient tried to obtain urine sample and again was unsuccessful. RN aware. EDP not in office.

## 2017-11-20 NOTE — ED Provider Notes (Signed)
  Physical Exam  BP (!) 169/71   Pulse 78   Temp 97.8 F (36.6 C) (Oral)   Resp 18   SpO2 95%   Physical Exam  ED Course/Procedures     Procedures  MDM   Pt comes in with cc of lower quadrant abd pain. He has urinary retention and has hx of renal tumor. Urology consulted, and cleared.  Foley catheter and UA ordered and pending. Anticipate d/c.   Varney Biles, MD 11/20/17 [phone]

## 2017-11-20 NOTE — ED Notes (Signed)
ED Provider at bedside. 

## 2017-11-20 NOTE — ED Provider Notes (Addendum)
[facility] DEPT Provider Note   CSN: [id number] Arrival date & time: 11/20/17  1135     History   Chief Complaint Chief Complaint  Patient presents with  . Hernia    HPI Bryan David is a 83 y.o. male.  Level 5 caveat for mild dementia.  History obtained from patient, wife, daughter.  Patient reports long-standing right inguinal hernia c assoc recent intermittent nausea, vomiting, diarrhea.  He is unable to urinate today.  No fever, sweats, chills, chest pain, dyspnea.  Remote history of "bladder and kidney cancer".        Past Medical History:  Diagnosis Date  . Anxiety   . Arthritis   . Biliary acute pancreatitis   . Bladder cancer ([place])   . Carpal tunnel syndrome   . Choledocholithiasis   . COPD (chronic obstructive pulmonary disease) ([place])   . Diverticulosis   . Esophageal stricture   . GERD (gastroesophageal reflux disease)   . Hiatal hernia   . Hypertension   . Mood disorder ([place]) 06/08/2016  . Osteoarthritis   . PE (pulmonary embolism)   . Pneumonia   . Pulmonary embolus ([place])   . Renal cancer ([place])   . Shortness of breath   . Urinary frequency   . Vitamin B12 deficiency     Patient Active Problem List   Diagnosis Date Noted  . Chronic diarrhea 10/18/2016  . Aortic stenosis 06/08/2016  . Mood disorder ([place]) 06/08/2016  . Advance directive discussed with patient 06/08/2016  . Thrombocytopenia ([place]) 05/20/2015  . Routine general medical examination at a health care facility 05/13/2014  . Atrial tachycardia ([place]) 06/27/2012  . GERD (gastroesophageal reflux disease) 09/23/2011  . Osteoarthritis, multiple sites 09/23/2011  . COPD (chronic obstructive pulmonary disease) ([place]) 04/27/2011  . Essential hypertension, benign 10/07/2009    Past Surgical History:  Procedure Laterality Date  . CARPAL TUNNEL RELEASE  12/11   Right---Dr Burney Gauze  . CHOLECYSTECTOMY    . CYSTOSTOMY W/ BLADDER BIOPSY     multiple  .  ESOPHAGOGASTRODUODENOSCOPY    . [place]   right  . PARTIAL NEPHRECTOMY    . TRANSURETHRAL RESECTION OF PROSTATE     limited       Home Medications    Prior to Admission medications   Medication Sig Start Date End Date Taking? Authorizing Provider  aspirin EC 81 MG tablet Take 81 mg by mouth 2 (two) times daily as needed.    Yes [provider]  loperamide (IMODIUM A-D) 2 MG tablet Take 2 mg by mouth as needed for diarrhea or loose stools.   Yes [provider]  loratadine (CLARITIN) 10 MG tablet Take 10 mg by mouth daily as needed for allergies.   Yes [provider]  metoprolol tartrate (LOPRESSOR) 25 MG tablet Take 0.5 tablets (12.5 mg total) by mouth 2 (two) times daily. 06/14/17  Yes Venia Carbon, MD  vitamin B-12 (CYANOCOBALAMIN) 500 MCG tablet Take 500 mcg by mouth 2 (two) times daily.   Yes [provider]    Family History Family History  Problem Relation Age of Onset  . Hypertension Mother   . Heart failure Mother   . Stroke Mother   . Lymphoma Son   . Stroke Brother   . Heart attack Brother   . Diabetes Sister   . Stroke Sister   . Colon cancer Neg Hx     Social History Social History   Tobacco Use  .  Smoking status: Former Smoker    Packs/Bryan: 1.00    Years: 40.00    Pack years: 40.00    Types: Cigarettes    Last attempt to quit: 06/25/1981    Years since quitting: 36.4  . Smokeless tobacco: Never Used  Substance Use Topics  . Alcohol use: No  . Drug use: No     Allergies   Acetaminophen   Review of Systems Review of Systems  All other systems reviewed and are negative.    Physical Exam Updated Vital Signs BP (!) 169/71   Pulse 78   Temp 97.8 F (36.6 C) (Oral)   Resp 18   SpO2 95%   Physical Exam  Constitutional: He is oriented to person, place, and time.  No acute distress; mild dementia  HENT:  Head: Normocephalic and atraumatic.  Eyes: Conjunctivae are normal.    Neck: Neck supple.  Cardiovascular: Normal rate and regular rhythm.  Pulmonary/Chest: Effort normal and breath sounds normal.  Abdominal:  Right inguinal hernia easily reduced  Genitourinary:  Genitourinary Comments: Rectal exam: No obvious stool burden in the rectum.  There was soft stool palpated at the tip of the finger.  Musculoskeletal: Normal range of motion.  Neurological: He is alert and oriented to person, place, and time.  Skin: Skin is warm and dry.  Psychiatric: He has a normal mood and affect. His behavior is normal.  Nursing note and vitals reviewed.    ED Treatments / Results  Labs (all labs ordered are listed, but only abnormal results are displayed) Labs Reviewed  COMPREHENSIVE METABOLIC PANEL - Abnormal; Notable for the following components:      Result Value   Glucose, Bld 127 (*)    Creatinine, Ser 1.25 (*)    ALT 14 (*)    Total Bilirubin 1.5 (*)    GFR calc non Af Amer 49 (*)    GFR calc Af Amer 57 (*)    All other components within normal limits  CBC - Abnormal; Notable for the following components:   WBC 12.9 (*)    Platelets 137 (*)    All other components within normal limits  LIPASE, BLOOD  URINALYSIS, ROUTINE W REFLEX MICROSCOPIC  OCCULT BLOOD X 1 CARD TO LAB, STOOL  POC OCCULT BLOOD, ED    EKG  EKG Interpretation None       Radiology Ct Abdomen Pelvis W Contrast  Result Date: 11/20/2017 CLINICAL DATA:  Lower abdominal pain. EXAM: CT ABDOMEN AND PELVIS WITH CONTRAST TECHNIQUE: Multidetector CT imaging of the abdomen and pelvis was performed using the standard protocol following bolus administration of intravenous contrast. CONTRAST:  80 mL ISOVUE-300 IOPAMIDOL (ISOVUE-300) INJECTION 61% COMPARISON:  CT scan of August 24, 2005. FINDINGS: Lower chest: No acute abnormality. Hepatobiliary: No focal liver abnormality is seen. Status post cholecystectomy. No biliary dilatation. Pancreas: Unremarkable. No pancreatic ductal dilatation or  surrounding inflammatory changes. Spleen: Normal in size without focal abnormality. Adrenals/Urinary Tract: Adrenal glands appear normal. Bilateral simple renal cysts are noted. 1.6 cm enhancing exophytic abnormality seen arising from upper pole of left kidney concerning for neoplasm. This was not present on prior exam. Mild bilateral hydronephrosis is noted without obstructing calculus. Mild urinary bladder distention is noted. Stomach/Bowel: Stomach is within normal limits. Appendix appears normal. No evidence of bowel wall thickening, distention, or inflammatory changes. Vascular/Lymphatic: Aortic atherosclerosis. No enlarged abdominal or pelvic lymph nodes. Reproductive: Severe prostatic enlargement is noted. Other: Large right inguinal hernia is noted which contains loops of small  bowel, but does not result in incarceration or obstruction. Musculoskeletal: No acute or significant osseous findings. IMPRESSION: 1.6 cm enhancing exophytic abnormality seen arising from upper pole of left kidney concerning for renal cell carcinoma. Urologic consultation is recommended. Mild bilateral hydronephrosis is noted without obstructing calculus. This may be due to mild urinary bladder distention. Aortic atherosclerosis. Large right inguinal hernia is noted which contains loops of small bowel, but does not result in incarceration or obstruction. Severe enlargement of prostate gland is noted. Electronically Signed   By: Marijo Conception, M.D.   On: 11/20/2017 15:13    Procedures Procedures (including critical care time)  Medications Ordered in ED Medications  ondansetron (ZOFRAN) injection 4 mg (not administered)  sodium chloride 0.9 % bolus 1,000 mL (0 mLs Intravenous Stopped 11/20/17 1457)  iopamidol (ISOVUE-300) 61 % injection 80 mL (80 mLs Intravenous Contrast Given 11/20/17 1439)     Initial Impression / Assessment and Plan / ED Course  I have reviewed the triage vital signs and the nursing  notes.  Pertinent labs & imaging results that were available during my care of the patient were reviewed by me and considered in my medical decision making (see chart for details).    Patient reports nausea, vomiting, diarrhea, right inguinal hernia, urinary retention.  I discussed the findings with the urologist on call Dr. Jeffie Pollock.  He recommended an indwelling Foley catheter and follow-up with urology.  Abnormal CT scan of left kidney discussed with patient, wife, daughter.  They understand to get urology follow-up. 1700:  Check out to Dr Kathrynn Humble  Final Clinical Impressions(s) / ED Diagnoses   Final diagnoses:  Urinary retention  Mass of left kidney  Diarrhea, unspecified type  Right inguinal hernia    ED Discharge Orders    None       Nat Christen, MD 11/20/17 1649    Nat Christen, MD 11/20/17 1712

## 2017-11-20 NOTE — ED Notes (Signed)
Bed: WA07 Expected date:  Expected time:  Means of arrival:  Comments: 

## 2017-11-20 NOTE — ED Notes (Signed)
No response when called from lobby. 

## 2017-11-20 NOTE — ED Notes (Signed)
Pt foley bag changed to a leg bag. He (and family) was also educated on bag emptying and changing.

## 2017-11-20 NOTE — Discharge Instructions (Signed)
You have a mass on your left kidney.  Additionally, you will need to keep the Foley catheter in place.  Call the urology office for follow-up.  Return if worse.

## 2017-11-20 NOTE — ED Triage Notes (Signed)
Pt complains of abdominal pain around a hernia for the past 4 days.

## 2017-11-20 NOTE — ED Notes (Signed)
Patient has tried to give urine sample. Patient unsuccessful. Patient will try again shortly.

## 2017-11-21 ENCOUNTER — Telehealth: Payer: Self-pay

## 2017-11-21 NOTE — Telephone Encounter (Addendum)
Spoke to pt's wife to follow-up on recent ER visit. He still has the catheter in. Urology said the soonest he could get in was 11-29-17. He will go in sooner for them to take the catheter out possibly.  She wanted to express her sincerest apologies for the way he was in the office a few weeks ago. She is thinking he is having some Dementia. His mood swings have gotten worse at times recently.

## 2017-11-22 NOTE — Telephone Encounter (Signed)
okay

## 2017-11-22 NOTE — Telephone Encounter (Signed)
Spoke to pts wife who states she does not believe the pt is worsening, and that she feels they will still be ok, living together.

## 2017-11-22 NOTE — Telephone Encounter (Signed)
Please let her know that I have heard it all and it is not a problem. I am mostly concerned about the two of them at home if he is getting worse.

## 2017-11-29 DIAGNOSIS — C649 Malignant neoplasm of unspecified kidney, except renal pelvis: Secondary | ICD-10-CM | POA: Diagnosis not present

## 2017-11-29 DIAGNOSIS — N401 Enlarged prostate with lower urinary tract symptoms: Secondary | ICD-10-CM | POA: Diagnosis not present

## 2017-11-29 DIAGNOSIS — C679 Malignant neoplasm of bladder, unspecified: Secondary | ICD-10-CM | POA: Diagnosis not present

## 2017-11-29 DIAGNOSIS — R338 Other retention of urine: Secondary | ICD-10-CM | POA: Diagnosis not present

## 2017-12-06 DIAGNOSIS — N1339 Other hydronephrosis: Secondary | ICD-10-CM | POA: Diagnosis not present

## 2017-12-06 DIAGNOSIS — Z8551 Personal history of malignant neoplasm of bladder: Secondary | ICD-10-CM | POA: Diagnosis not present

## 2017-12-20 DIAGNOSIS — C679 Malignant neoplasm of bladder, unspecified: Secondary | ICD-10-CM | POA: Diagnosis not present

## 2018-01-03 DIAGNOSIS — R338 Other retention of urine: Secondary | ICD-10-CM | POA: Diagnosis not present

## 2018-01-09 DIAGNOSIS — R338 Other retention of urine: Secondary | ICD-10-CM | POA: Diagnosis not present

## 2018-02-01 DIAGNOSIS — N401 Enlarged prostate with lower urinary tract symptoms: Secondary | ICD-10-CM | POA: Diagnosis not present

## 2018-02-01 DIAGNOSIS — R338 Other retention of urine: Secondary | ICD-10-CM | POA: Diagnosis not present

## 2018-02-01 DIAGNOSIS — Z8551 Personal history of malignant neoplasm of bladder: Secondary | ICD-10-CM | POA: Diagnosis not present

## 2018-02-17 ENCOUNTER — Encounter (HOSPITAL_COMMUNITY): Payer: Self-pay | Admitting: Emergency Medicine

## 2018-02-17 ENCOUNTER — Other Ambulatory Visit: Payer: Self-pay

## 2018-02-17 ENCOUNTER — Emergency Department (HOSPITAL_COMMUNITY): Payer: Medicare Other

## 2018-02-17 ENCOUNTER — Emergency Department (HOSPITAL_COMMUNITY)
Admission: EM | Admit: 2018-02-17 | Discharge: 2018-02-18 | Disposition: A | Discharge status: Home / Self Care | Payer: Medicare Other | Attending: Emergency Medicine | Admitting: Emergency Medicine

## 2018-02-17 DIAGNOSIS — Y738 Miscellaneous gastroenterology and urology devices associated with adverse incidents, not elsewhere classified: Secondary | ICD-10-CM | POA: Diagnosis not present

## 2018-02-17 DIAGNOSIS — F0390 Unspecified dementia without behavioral disturbance: Secondary | ICD-10-CM | POA: Insufficient documentation

## 2018-02-17 DIAGNOSIS — J449 Chronic obstructive pulmonary disease, unspecified: Secondary | ICD-10-CM | POA: Insufficient documentation

## 2018-02-17 DIAGNOSIS — I10 Essential (primary) hypertension: Secondary | ICD-10-CM | POA: Diagnosis not present

## 2018-02-17 DIAGNOSIS — R1030 Lower abdominal pain, unspecified: Secondary | ICD-10-CM | POA: Diagnosis present

## 2018-02-17 DIAGNOSIS — Z79899 Other long term (current) drug therapy: Secondary | ICD-10-CM | POA: Diagnosis not present

## 2018-02-17 DIAGNOSIS — Z87891 Personal history of nicotine dependence: Secondary | ICD-10-CM | POA: Insufficient documentation

## 2018-02-17 DIAGNOSIS — T839XXA Unspecified complication of genitourinary prosthetic device, implant and graft, initial encounter: Secondary | ICD-10-CM | POA: Diagnosis not present

## 2018-02-17 DIAGNOSIS — N390 Urinary tract infection, site not specified: Secondary | ICD-10-CM

## 2018-02-17 DIAGNOSIS — Z7982 Long term (current) use of aspirin: Secondary | ICD-10-CM | POA: Insufficient documentation

## 2018-02-17 DIAGNOSIS — K4090 Unilateral inguinal hernia, without obstruction or gangrene, not specified as recurrent: Secondary | ICD-10-CM | POA: Diagnosis not present

## 2018-02-17 DIAGNOSIS — T83098A Other mechanical complication of other indwelling urethral catheter, initial encounter: Secondary | ICD-10-CM | POA: Diagnosis not present

## 2018-02-17 DIAGNOSIS — R109 Unspecified abdominal pain: Secondary | ICD-10-CM | POA: Diagnosis not present

## 2018-02-17 DIAGNOSIS — R479 Unspecified speech disturbances: Secondary | ICD-10-CM | POA: Diagnosis not present

## 2018-02-17 DIAGNOSIS — R1084 Generalized abdominal pain: Secondary | ICD-10-CM | POA: Diagnosis not present

## 2018-02-17 DIAGNOSIS — K4030 Unilateral inguinal hernia, with obstruction, without gangrene, not specified as recurrent: Secondary | ICD-10-CM | POA: Diagnosis not present

## 2018-02-17 LAB — COMPREHENSIVE METABOLIC PANEL
ALT: 9 U/L — AB (ref 17–63)
AST: 21 U/L (ref 15–41)
Albumin: 3.8 g/dL (ref 3.5–5.0)
Alkaline Phosphatase: 84 U/L (ref 38–126)
Anion gap: 11 (ref 5–15)
BUN: 13 mg/dL (ref 6–20)
CHLORIDE: 107 mmol/L (ref 101–111)
CO2: 23 mmol/L (ref 22–32)
CREATININE: 1.03 mg/dL (ref 0.61–1.24)
Calcium: 9 mg/dL (ref 8.9–10.3)
GFR calc non Af Amer: >60 mL/min (ref >60)
Glucose, Bld: 164 mg/dL — ABNORMAL HIGH (ref 65–99)
POTASSIUM: 3.7 mmol/L (ref 3.5–5.1)
SODIUM: 141 mmol/L (ref 135–145)
Total Bilirubin: 1 mg/dL (ref 0.3–1.2)
Total Protein: 7 g/dL (ref 6.5–8.1)

## 2018-02-17 LAB — CBC
HEMATOCRIT: 44 % (ref 39.0–52.0)
Hemoglobin: 14.1 g/dL (ref 13.0–17.0)
MCH: 28.6 pg (ref 26.0–34.0)
MCHC: 32 g/dL (ref 30.0–36.0)
MCV: 89.2 fL (ref 78.0–100.0)
PLATELETS: 152 10*3/uL (ref 150–400)
RBC: 4.93 MIL/uL (ref 4.22–5.81)
RDW: 14.5 % (ref 11.5–15.5)
WBC: 14.1 10*3/uL — ABNORMAL HIGH (ref 4.0–10.5)

## 2018-02-17 LAB — I-STAT CG4 LACTIC ACID, ED: Lactic Acid, Venous: 2.56 mmol/L (ref 0.5–1.9)

## 2018-02-17 LAB — LIPASE, BLOOD: LIPASE: 30 U/L (ref 11–51)

## 2018-02-17 MED ORDER — IOPAMIDOL (ISOVUE-300) INJECTION 61%
INTRAVENOUS | Status: AC
  Filled 2018-02-17: qty 100

## 2018-02-17 MED ORDER — MORPHINE SULFATE (PF) 4 MG/ML IV SOLN
4.0000 mg | Freq: Once | INTRAVENOUS | Status: AC
  Administered 2018-02-18: 4 mg via INTRAVENOUS
  Filled 2018-02-17: qty 1

## 2018-02-17 MED ORDER — ONDANSETRON HCL 4 MG/2ML IJ SOLN
4.0000 mg | Freq: Once | INTRAMUSCULAR | Status: AC
  Administered 2018-02-18: 4 mg via INTRAVENOUS
  Filled 2018-02-17: qty 2

## 2018-02-17 MED ORDER — IOPAMIDOL (ISOVUE-300) INJECTION 61%
INTRAVENOUS | Status: AC
  Administered 2018-02-18: 100 mL
  Filled 2018-02-17: qty 100

## 2018-02-17 MED ORDER — SODIUM CHLORIDE 0.9 % IV BOLUS (SEPSIS)
500.0000 mL | Freq: Once | INTRAVENOUS | Status: AC
  Administered 2018-02-18: 500 mL via INTRAVENOUS

## 2018-02-17 NOTE — ED Provider Notes (Signed)
[facility] EMERGENCY DEPARTMENT Provider Note   CSN: [id number] Arrival date & time: 02/17/18  2131     History   Chief Complaint Chief Complaint  Patient presents with  . Abdominal Pain    HPI Bryan David is a 84 y.o. male.  Level 5 caveat for dementia.  Patient here with family complaining of abdominal pain ongoing all day.  He has difficulty describing his pain.  He has a chronic indwelling Foley catheter for prostate problems that was last changed about a month ago.  They report the catheter has not been draining all day.  Patient has a known right inguinal hernia as well as renal mass that is not being treated.  They deny any fever, chills, nausea or vomiting.  Patient is not eaten very much today.  They states he has been having multiple episodes of loose stool and has had irregular bowel movements for quite some time.  He states he has not eaten very much today at all.  No fever.  He has been yelling in pain but he cannot localize this pain.  Family thinks it is due to his catheter not working.   The history is provided by a relative and the patient.  Abdominal Pain      Past Medical History:  Diagnosis Date  . Anxiety   . Arthritis   . Biliary acute pancreatitis   . Bladder cancer ([place])   . Carpal tunnel syndrome   . Choledocholithiasis   . COPD (chronic obstructive pulmonary disease) ([place])   . Diverticulosis   . Esophageal stricture   . GERD (gastroesophageal reflux disease)   . Hiatal hernia   . Hypertension   . Mood disorder ([place]) 06/08/2016  . Osteoarthritis   . PE (pulmonary embolism)   . Pneumonia   . Pulmonary embolus ([place])   . Renal cancer ([place])   . Shortness of breath   . Urinary frequency   . Vitamin B12 deficiency     Patient Active Problem List   Diagnosis Date Noted  . Chronic diarrhea 10/18/2016  . Aortic stenosis 06/08/2016  . Mood disorder ([place]) 06/08/2016  . Advance directive discussed with patient 06/08/2016  .  Thrombocytopenia ([place]) 05/20/2015  . Routine general medical examination at a health care facility 05/13/2014  . Atrial tachycardia ([place]) 06/27/2012  . GERD (gastroesophageal reflux disease) 09/23/2011  . Osteoarthritis, multiple sites 09/23/2011  . COPD (chronic obstructive pulmonary disease) ([place]) 04/27/2011  . Essential hypertension, benign 10/07/2009    Past Surgical History:  Procedure Laterality Date  . CARPAL TUNNEL RELEASE  12/11   Right---Dr Burney Gauze  . CHOLECYSTECTOMY    . CYSTOSTOMY W/ BLADDER BIOPSY     multiple  . ESOPHAGOGASTRODUODENOSCOPY    . [place]   right  . PARTIAL NEPHRECTOMY    . TRANSURETHRAL RESECTION OF PROSTATE     limited       Home Medications    Prior to Admission medications   Medication Sig Start Date End Date Taking? Authorizing Provider  aspirin EC 81 MG tablet Take 81 mg by mouth 2 (two) times daily as needed.     [provider]  loperamide (IMODIUM A-D) 2 MG tablet Take 2 mg by mouth as needed for diarrhea or loose stools.    [provider]  loratadine (CLARITIN) 10 MG tablet Take 10 mg by mouth daily as needed for allergies.    [provider]  metoprolol tartrate (LOPRESSOR) 25 MG  tablet Take 0.5 tablets (12.5 mg total) by mouth 2 (two) times daily. 06/14/17   Venia Carbon, MD  vitamin B-12 (CYANOCOBALAMIN) 500 MCG tablet Take 500 mcg by mouth 2 (two) times daily.    [provider]    Family History Family History  Problem Relation Age of Onset  . Hypertension Mother   . Heart failure Mother   . Stroke Mother   . Lymphoma Son   . Stroke Brother   . Heart attack Brother   . Diabetes Sister   . Stroke Sister   . Colon cancer Neg Hx     Social History Social History   Tobacco Use  . Smoking status: Former Smoker    Packs/day: 1.00    Years: 40.00    Pack years: 40.00    Types: Cigarettes    Last attempt to quit: 06/25/1981    Years since quitting: 36.6  .  Smokeless tobacco: Never Used  Substance Use Topics  . Alcohol use: No  . Drug use: No     Allergies   Acetaminophen   Review of Systems Review of Systems  Unable to perform ROS: Dementia  Gastrointestinal: Positive for abdominal pain.     Physical Exam Updated Vital Signs BP (!) 151/104 (BP Location: Right Arm)   Pulse 73   Temp (!) 97.5 F (36.4 C) (Oral)   Resp 16   SpO2 96%   Physical Exam  Constitutional: He is oriented to person, place, and time. He appears well-developed and well-nourished. No distress.   Hard of hearing, yelling in pain.  I  HENT:  Head: Normocephalic and atraumatic.  Mouth/Throat: Oropharynx is clear and moist. No oropharyngeal exudate.  Eyes: Conjunctivae and EOM are normal. Pupils are equal, round, and reactive to light.  Neck: Normal range of motion. Neck supple.  No meningismus.  Cardiovascular: Normal rate, regular rhythm, normal heart sounds and intact distal pulses.  No murmur heard. Pulmonary/Chest: Effort normal and breath sounds normal. No respiratory distress. He exhibits no tenderness.  Abdominal: Soft. There is tenderness. There is no rebound and no guarding.  Abdomen is soft, diffusely tender.  There is a large right inguinal hernia that is reducible.  Genitourinary:  Genitourinary Comments: Foley catheter in place.  No drainage in the leg bag.  Testicles are nontender  Musculoskeletal: Normal range of motion. He exhibits no edema or tenderness.  Neurological: He is alert and oriented to person, place, and time. No cranial nerve deficit. He exhibits normal muscle tone. Coordination normal.  No ataxia on finger to nose bilaterally. No pronator drift. 5/5 strength throughout. CN 2-12 intact.Equal grip strength. Sensation intact.   Skin: Skin is warm.  Psychiatric: He has a normal mood and affect. His behavior is normal.  Nursing note and vitals reviewed.    ED Treatments / Results  Labs (all labs ordered are listed, but only  abnormal results are displayed) Labs Reviewed  COMPREHENSIVE METABOLIC PANEL - Abnormal; Notable for the following components:      Result Value   Glucose, Bld 164 (*)    ALT 9 (*)    All other components within normal limits  CBC - Abnormal; Notable for the following components:   WBC 14.1 (*)    All other components within normal limits  URINALYSIS, ROUTINE W REFLEX MICROSCOPIC - Abnormal; Notable for the following components:   APPearance TURBID (*)    Hgb urine dipstick MODERATE (*)    Protein, ur 100 (*)  Leukocytes, UA LARGE (*)    Bacteria, UA RARE (*)    All other components within normal limits  I-STAT CG4 LACTIC ACID, ED - Abnormal; Notable for the following components:   Lactic Acid, Venous 2.56 (*)    All other components within normal limits  URINE CULTURE  LIPASE, BLOOD  I-STAT CG4 LACTIC ACID, ED  I-STAT CG4 LACTIC ACID, ED  I-STAT CG4 LACTIC ACID, ED    EKG  EKG Interpretation None       Radiology Ct Head Wo Contrast  Result Date: 02/18/2018 CLINICAL DATA:  Speech difficulty EXAM: CT HEAD WITHOUT CONTRAST TECHNIQUE: Contiguous axial images were obtained from the base of the skull through the vertex without intravenous contrast. COMPARISON:  None. FINDINGS: Brain: No mass lesion, intraparenchymal hemorrhage or extra-axial collection. No evidence of acute cortical infarct. Normal appearance of the brain parenchyma and extra axial spaces for age. Vascular: Atherosclerotic calcification of the internal carotid arteries at the skull base. No hyperdense vessel. Skull: Normal visualized skull base, calvarium and extracranial soft tissues. Sinuses/Orbits: No sinus fluid levels or advanced mucosal thickening. No mastoid effusion. Normal orbits. IMPRESSION: No acute intracranial abnormality. Atherosclerotic calcification of the internal carotid arteries at the skull base, but otherwise normal aging brain. Electronically Signed   By: Ulyses Jarred M.D.   On: 02/18/2018  01:50   Ct Abdomen Pelvis W Contrast  Result Date: 02/18/2018 CLINICAL DATA:  Abdominal pain. History of right inguinal hernia repair. History of partial nephrectomy for renal cancer. EXAM: CT ABDOMEN AND PELVIS WITH CONTRAST TECHNIQUE: Multidetector CT imaging of the abdomen and pelvis was performed using the standard protocol following bolus administration of intravenous contrast. CONTRAST:  100 cc ISOVUE-300 IOPAMIDOL (ISOVUE-300) INJECTION 61% COMPARISON:  11/20/2017 CT abdomen/pelvis. FINDINGS: Motion degraded scan, limiting assessment. Lower chest: Small dependent bilateral pleural effusions. Mild dependent bilateral lower lobe atelectasis. Small pleural effusions/thickening, slightly increased. Hepatobiliary: Normal liver size. No liver mass. Cholecystectomy. No biliary ductal dilatation. Pancreas: Normal, with no mass or duct dilation. Spleen: Normal size. No mass. Adrenals/Urinary Tract: Normal adrenals. No hydronephrosis. Hyperdense 1.5 cm partially exophytic renal cortical mass in the lower right kidney (series 3/image 39), mildly increased from 1.3 cm on 11/20/2017. Slightly hypodense 1.7 cm partially exophytic renal cortical mass in the anterior upper right kidney (series 3/image 26) mildly increased from 1.5 cm. Avidly heterogeneously enhancing 1.7 cm partially exophytic renal cortical mass in the posterior upper left kidney (series 3/image 23), minimally increased from 1.7 cm. Additional heterogeneous subcentimeter hypodense renal cortical lesions in the lower left kidney, too small to characterize. Additional simple renal cysts in both kidneys, largest 3.8 cm in the lateral upper left kidney. Foley catheter is in place within nondistended bladder. Marked diffuse bladder wall thickening and hyperenhancement with haziness of the perivesical fat. Expected minimal gas in the nondependent bladder. Stomach/Bowel: Normal non-distended stomach. Normal caliber small bowel with no small bowel wall  thickening. There are multiple segments of small bowel in the left abdomen and pelvis demonstrating mild wall thickening and mucosal hyperenhancement (for example in the left abdomen on series 3/image 46). There a few scattered mildly dilated small bowel loops with air-fluid levels measuring up to the 3.5 cm diameter. No discrete small bowel caliber transition. Normal appendix. Moderate sigmoid diverticulosis, with no large bowel wall thickening or pericolonic fat stranding. Vascular/Lymphatic: Atherosclerotic abdominal aorta with stable ectatic 2.7 cm infrarenal abdominal aorta. Patent portal, splenic, hepatic and renal veins. No pathologically enlarged lymph nodes in the abdomen or pelvis.  Reproductive: Markedly enlarged prostate with nonspecific internal prostatic calcifications, unchanged. Other: No pneumoperitoneum. No focal fluid collection. Trace free fluid in the deep pelvis. Small fat containing bilateral inguinal hernias. Musculoskeletal: No aggressive appearing focal osseous lesions. Marked thoracolumbar spondylosis. IMPRESSION: 1. Marked diffuse bladder wall thickening and hyperenhancement with perivesical fat haziness, suggestive of acute cystitis. Foley catheter is in place within the bladder. No hydronephrosis. Markedly enlarged prostate. 2. Multiple small avidly enhancing bilateral renal cortical masses suggestive of multifocal renal cell carcinoma, which are mildly increased in size since 11/20/2017 CT. Dedicated renal mass protocol MRI or CT abdomen without and with IV contrast may be performed on a short term outpatient basis for further characterization, as clinically warranted. 3. Multiple segments of small bowel wall thickening and mucosal hyperenhancement. Scattered mildly dilated small bowel loops with air-fluid levels. No discrete small bowel caliber transition. Findings are most suggestive of a nonspecific infectious or inflammatory enteritis with associated mild adynamic ileus. 4. Small  dependent bilateral pleural effusions. Trace free fluid in the deep pelvis. 5. Small fat containing bilateral inguinal hernias. 6. Moderate sigmoid diverticulosis. 7. Aortic Atherosclerosis (ICD10-I70.0). Stable ectatic 2.7 cm infrarenal abdominal aorta at risk for aneurysm development. Recommend followup by ultrasound in 5 years. This recommendation follows ACR consensus guidelines: White Paper of the ACR Incidental Findings Committee II on Vascular Findings. J Am Coll Radiol 2013; 10:789-794. Electronically Signed   By: Ilona Sorrel M.D.   On: 02/18/2018 02:10    Procedures Procedures (including critical care time)  Medications Ordered in ED Medications  sodium chloride 0.9 % bolus 500 mL (not administered)  morphine 4 MG/ML injection 4 mg (not administered)  ondansetron (ZOFRAN) injection 4 mg (not administered)     Initial Impression / Assessment and Plan / ED Course  I have reviewed the triage vital signs and the nursing notes.  Pertinent labs & imaging results that were available during my care of the patient were reviewed by me and considered in my medical decision making (see chart for details).    Patient with known right inguinal hernia as well as renal mass and indwelling catheter presenting with abdominal pain and likely urinary retention.  His vitals are stable.  His abdomen is soft.  His inguinal hernia is soft and reducible.  He has not been vomiting.  Bladder scan to 289 mL's.  Patient's pain has resolved after Foley catheter was exchanged.  Bag is draining clear urine.  Hernia is reducible.  Creatinine appears to be at baseline.  Urine consistent with infection.  Culture sent.  Patient given IV Rocephin.  CT scan obtained given his ongoing abdominal pain and reducible hernia.  Initial lactic acidosis has resolved.  CT results discussed with patient and family.  They are aware of renal mass.  Patient states he does not like doctors and does not want to follow-up with anybody.   He does not want to come in the hospital.  They are aware he needs treatment for his urinary tract infection as well as possible ileitis.  Patient does not want to be admitted.  He wishes to go home.   he does not appear to be toxic or septic.  His abdominal pain is resolved.  There is no evidence of strangulated or incarcerated hernia. It seems reasonable to treat UTI and well as potential ileitis with PO antibiotics. He is tolerating PO and having flatus.  Discussed followup with PCP as well as urology.  Return precautions discussed.  BP 114/68   Pulse  90   Temp (!) 97.5 F (36.4 C) (Oral)   Resp 20   SpO2 97%    Final Clinical Impressions(s) / ED Diagnoses   Final diagnoses:  Urinary tract infection without hematuria, site unspecified  Reducible inguinal hernia  Foley catheter problem, initial encounter [facility])    ED Discharge Orders    None       Ezequiel Essex, MD 02/18/18 1654

## 2018-02-17 NOTE — ED Notes (Signed)
Bladder scanned patient, had 289 mL in bladder

## 2018-02-17 NOTE — ED Triage Notes (Signed)
The patient has had a urinary catheter for about two months for prostate problems.  Initially he was seen at [facility] and the catheter was placed and was seen by Dr. Gloriann Loan.  Dr. Gloriann Loan diagnosed w/enlarged prostate and has been changing the catheter with for him.  Today, he presents with Abdominal pain but denies pain on his penis. He says it hurst everywhere.  The patient does have a hernia and it has been there for several years.  The family also did say they think he has dementia.

## 2018-02-18 ENCOUNTER — Encounter (HOSPITAL_COMMUNITY): Payer: Self-pay | Admitting: Radiology

## 2018-02-18 ENCOUNTER — Emergency Department (HOSPITAL_COMMUNITY): Payer: Medicare Other

## 2018-02-18 DIAGNOSIS — R479 Unspecified speech disturbances: Secondary | ICD-10-CM | POA: Diagnosis not present

## 2018-02-18 DIAGNOSIS — R109 Unspecified abdominal pain: Secondary | ICD-10-CM | POA: Diagnosis not present

## 2018-02-18 LAB — URINALYSIS, ROUTINE W REFLEX MICROSCOPIC
Bilirubin Urine: NEGATIVE
GLUCOSE, UA: NEGATIVE mg/dL
Ketones, ur: NEGATIVE mg/dL
NITRITE: NEGATIVE
PH: 8 (ref 5.0–8.0)
PROTEIN: 100 mg/dL — AB
SPECIFIC GRAVITY, URINE: 1.012 (ref 1.005–1.030)
Squamous Epithelial / LPF: NONE SEEN

## 2018-02-18 LAB — I-STAT CG4 LACTIC ACID, ED: Lactic Acid, Venous: 0.92 mmol/L (ref 0.5–1.9)

## 2018-02-18 MED ORDER — METRONIDAZOLE 500 MG PO TABS: 500.0000 mg | ORAL_TABLET | Freq: Two times a day (BID) | ORAL | 0 refills | Status: DC

## 2018-02-18 MED ORDER — CIPROFLOXACIN HCL 500 MG PO TABS: 500.0000 mg | ORAL_TABLET | Freq: Two times a day (BID) | ORAL | 0 refills | Status: DC

## 2018-02-18 MED ORDER — SODIUM CHLORIDE 0.9 % IV SOLN
1.0000 g | Freq: Once | INTRAVENOUS | Status: AC
  Administered 2018-02-18: 1 g via INTRAVENOUS
  Filled 2018-02-18: qty 10

## 2018-02-18 MED ORDER — SODIUM CHLORIDE 0.9 % IV BOLUS (SEPSIS)
1000.0000 mL | Freq: Once | INTRAVENOUS | Status: AC
  Administered 2018-02-18: 1000 mL via INTRAVENOUS

## 2018-02-18 NOTE — Discharge Instructions (Signed)
Take the antibiotics as prescribed.  Follow-up with your primary care physician as well as urology.  Return to the ED if you develop worsening symptoms including fever, abdominal pain, vomiting or any other concerns.  Your CT scan shows that you have inflammation of your bladder as well as use your intestines.  The mass in your kidney appears to be increasing in size.

## 2018-02-18 NOTE — ED Notes (Signed)
Pt and family understood dc material. NAD noted. Scripts given at [facility]. Pt was switched to a leg bag for dc

## 2018-02-20 ENCOUNTER — Telehealth: Payer: Self-pay

## 2018-02-20 LAB — URINE CULTURE: Culture: >=100000 — AB

## 2018-02-20 NOTE — Telephone Encounter (Signed)
Copied from [place] [id number]. Topic: Quick Communication - Office Called Patient >> Feb 20, 2018  9:42 AM Pilar Grammes, CMA wrote: Reason for CRM: Called pt to find out how he was doing after recent ER visit for UTI. He does not need an OV with Dr Silvio Pate unless he wants one.

## 2018-02-21 ENCOUNTER — Telehealth: Payer: Self-pay | Admitting: Emergency Medicine

## 2018-02-21 NOTE — Telephone Encounter (Signed)
Post ED Visit - Positive Culture Follow-up  Culture report reviewed by antimicrobial stewardship pharmacist:  []  Elenor Quinones, Pharm.D. []  Heide Guile, Pharm.D., BCPS AQ-ID []  Parks Neptune, Pharm.D., BCPS []  Alycia Rossetti, Pharm.D., BCPS []  [place], Pharm.D., BCPS, AAHIVP [x]  Legrand Como, Pharm.D., BCPS, AAHIVP []  Salome Arnt, PharmD, BCPS []  Jalene Mullet, PharmD []  Vincenza Hews, PharmD, BCPS  Positive urine culture Treated with ciprofloxacin and metronidazole, organism sensitive to the same and no further patient follow-up is required at this time.  [name] 02/21/2018, 12:23 PM

## 2018-02-22 DIAGNOSIS — N401 Enlarged prostate with lower urinary tract symptoms: Secondary | ICD-10-CM | POA: Diagnosis not present

## 2018-02-22 DIAGNOSIS — R338 Other retention of urine: Secondary | ICD-10-CM | POA: Diagnosis not present

## 2018-02-22 DIAGNOSIS — N3000 Acute cystitis without hematuria: Secondary | ICD-10-CM | POA: Diagnosis not present

## 2018-03-09 ENCOUNTER — Ambulatory Visit: Payer: Self-pay

## 2018-03-09 NOTE — Telephone Encounter (Signed)
I spoke with Bryan David (DPR signed) she said pt is OK to wait until appt on 03/13/18 to see Dr Silvio Pate; No SI/HI. If pt condition worsens prior to appt Bryan David will cb. Dr Silvio Pate out of office until 03/13/18; will FYI Dr Silvio Pate and Dr Damita Dunnings.

## 2018-03-09 NOTE — Telephone Encounter (Signed)
Noted.  I'll defer to PCP.  Routed to PCP as FYI.

## 2018-03-09 NOTE — Telephone Encounter (Signed)
Daughter called - concerned that pt. Is having increased weakness,poor appetite, shortness of breath, anxiety and depression.States pt. Will not want to take anything for depression, but daughter feels it would help him. She reports he yells at his wife and gets upset.He is also dealing with having an indwelling Foley catheter. Reason for Disposition . [1] MILD weakness (i.e., does not interfere with ability to work, go to school, normal activities) AND [2] persists > 1 week  Answer Assessment - Initial Assessment Questions 1. DESCRIPTION: "Describe how you are feeling."     Feels weak 2. SEVERITY: "How bad is it?"  "Can you stand and walk?"   - MILD - Feels weak or tired, but does not interfere with work, school or normal activities   - [place] to stand and walk; weakness interferes with work, school, or normal activities   - SEVERE - Unable to stand or walk     Moderate 3. ONSET:  "When did the weakness begin?"     Began several weeks ago but has gotten worse over the past 2 weeks 4. CAUSE: "What do you think is causing the weakness?"     Unsure 5. MEDICINES: "Have you recently started a new medicine or had a change in the amount of a medicine?"     No 6. OTHER SYMPTOMS: "Do you have any other symptoms?" (e.g., chest pain, fever, cough, SOB, vomiting, diarrhea, bleeding)     Some shortness of breath 7. PREGNANCY: "Is there any chance you are pregnant?" "When was your last menstrual period?"     No  Protocols used: WEAKNESS (GENERALIZED) AND FATIGUE-A-AH

## 2018-03-09 NOTE — Telephone Encounter (Signed)
Copied from [place]. Topic: General - Other >> Mar 09, 2018  9:28 AM Yvette Rack wrote: Reason for CRM: patient calling to speak with Verna Desrocher she states that she was just speaking with her   >> Mar 09, 2018  9:44 AM Helene Shoe, LPN wrote: Mrs Ginley said when pt was seen by Dr Silvio Pate last visit pt was upset due to diarrhea for so long;pt has not taken any more of anti diarrhea medicine and has not had episodes of being upset or angry.  Pt has also stopped taking his BP med. Mrs does not have a way of checking pts BP. Mrs Woon said pt just decided to stop all med and see how he does. Mrs Manas still does not think pt needs to be seen sooner than 03/13/18 but wanted Dr Silvio Pate to know this info.pt is also f/u next week with urologist about Foley cath. Mrs Hunger actually calling about her husband Bryan David DOB 06-Jun-1925. Will copy this message to [facility] chart.

## 2018-03-10 NOTE — Telephone Encounter (Signed)
Unfortunately, his cognitive issues have continued to make caring for him difficult----plus the morbidity of the urinary retention, etc Hopefully there will be positive things we can do at next week's appt

## 2018-03-13 ENCOUNTER — Telehealth: Payer: Self-pay

## 2018-03-13 ENCOUNTER — Encounter: Payer: Self-pay | Admitting: Internal Medicine

## 2018-03-13 ENCOUNTER — Ambulatory Visit (INDEPENDENT_AMBULATORY_CARE_PROVIDER_SITE_OTHER): Payer: Medicare Other | Admitting: Internal Medicine

## 2018-03-13 VITALS — BP 136/82 | HR 100 | Temp 97.4°F | Wt 158.2 lb

## 2018-03-13 DIAGNOSIS — F0391 Unspecified dementia with behavioral disturbance: Secondary | ICD-10-CM | POA: Diagnosis not present

## 2018-03-13 DIAGNOSIS — R339 Retention of urine, unspecified: Secondary | ICD-10-CM | POA: Diagnosis not present

## 2018-03-13 DIAGNOSIS — N2889 Other specified disorders of kidney and ureter: Secondary | ICD-10-CM | POA: Insufficient documentation

## 2018-03-13 DIAGNOSIS — I471 Supraventricular tachycardia: Secondary | ICD-10-CM | POA: Diagnosis not present

## 2018-03-13 DIAGNOSIS — F39 Unspecified mood [affective] disorder: Secondary | ICD-10-CM

## 2018-03-13 DIAGNOSIS — I4719 Other supraventricular tachycardia: Secondary | ICD-10-CM

## 2018-03-13 DIAGNOSIS — J449 Chronic obstructive pulmonary disease, unspecified: Secondary | ICD-10-CM | POA: Diagnosis not present

## 2018-03-13 DIAGNOSIS — F0390 Unspecified dementia without behavioral disturbance: Secondary | ICD-10-CM

## 2018-03-13 HISTORY — DX: Unspecified dementia, unspecified severity, without behavioral disturbance, psychotic disturbance, mood disturbance, and anxiety: F03.90

## 2018-03-13 MED ORDER — MEMANTINE HCL 5 MG PO TABS: 5.0000 mg | ORAL_TABLET | Freq: Two times a day (BID) | ORAL | 11 refills | Status: DC

## 2018-03-13 NOTE — Telephone Encounter (Signed)
Copied from [place] [id number]. Topic: General - Other >> Mar 13, 2018  3:16 PM Oneta Rack wrote: Osvaldo Human name: Anderson Malta from [facility] Call back number: PA depart [phone]    Reason for call:  [facility] calling on patient behalf stating memantine [facility]) 5 MG tablet was denied due to PA needed, please advise   >> Mar 13, 2018  3:23 PM Oneta Rack wrote: Osvaldo Human name: Anderson Malta from [facility] Call back number: PA depart [phone]    Reason for call:  [facility] calling on patient behalf stating memantine (NAMENDA) 5 MG tablet was denied due to PA needed, please advise

## 2018-03-13 NOTE — Assessment & Plan Note (Signed)
Easy DOE but no cough meds are not really feasible (and not likely to help)

## 2018-03-13 NOTE — Assessment & Plan Note (Signed)
Ongoing decline Suspect this is Alzheimer's type Discussed increased care needs--and hiring some help

## 2018-03-13 NOTE — Assessment & Plan Note (Signed)
CT appearance consistent with carcinoma He doesn't want further workup or treatment If ongoing functional decline--would consult with hospice

## 2018-03-13 NOTE — Assessment & Plan Note (Signed)
Some anger and fires up at times Daughter especially concerned Will try low dose memantine Consider SSRI like sertraline

## 2018-03-13 NOTE — Assessment & Plan Note (Signed)
Wife managing the Foley

## 2018-03-13 NOTE — Progress Notes (Signed)
Subjective:    Patient ID: Bryan David, male    DOB: 05-19-1925, 84 y.o.   MRN: [id number]  HPI Here with wife and daughter June  Has urinary retention Foley catheter for many months Recent ER visit for UTI CT showed increase in size of renal lesion  Mood has been better Not having the anger--- daughter concerned he has anxiety Gets upset about anything that isn't just the way he wants it Thankful other times  Walks only short distances Has cane Easy DOE No chest pain No palpitations Cough with chronic sputum production  Wife helps with showering and dressing Mostly needs help with bathroom also (due to easy dyspnea) Daughter now stepping in to help with food, etc Wife has to do catheter care  Memory changes have worsened He stopped all his meds other than the asa   Current Outpatient Medications on File Prior to Visit  Medication Sig Dispense Refill  . aspirin EC 81 MG tablet Take 81 mg by mouth 2 (two) times daily as needed for mild pain.     . finasteride (PROSCAR) 5 MG tablet Take 5 mg by mouth daily.    Marland Kitchen loperamide (IMODIUM A-D) 2 MG tablet Take 2 mg by mouth as needed for diarrhea or loose stools.    Marland Kitchen loratadine (CLARITIN) 10 MG tablet Take 10 mg by mouth daily as needed for allergies.    . metoprolol tartrate (LOPRESSOR) 25 MG tablet Take 0.5 tablets (12.5 mg total) by mouth 2 (two) times daily. (Patient not taking: Reported on 03/13/2018) 90 tablet 3  . tamsulosin (FLOMAX) 0.4 MG CAPS capsule Take 0.4 mg by mouth daily.    . vitamin B-12 (CYANOCOBALAMIN) 500 MCG tablet Take 500 mcg by mouth 2 (two) times daily.     No current facility-administered medications on file prior to visit.     Allergies  Allergen Reactions  . Acetaminophen Other (See Comments)    "made him feel silly"    Past Medical History:  Diagnosis Date  . Anxiety   . Arthritis   . Biliary acute pancreatitis   . Bladder cancer ([place])   . Carpal tunnel syndrome   .  Choledocholithiasis   . COPD (chronic obstructive pulmonary disease) ([place])   . Diverticulosis   . Esophageal stricture   . GERD (gastroesophageal reflux disease)   . Hiatal hernia   . Hypertension   . Mood disorder ([place]) 06/08/2016  . Osteoarthritis   . PE (pulmonary embolism)   . Pneumonia   . Pulmonary embolus ([place])   . Renal cancer ([place])   . Shortness of breath   . Urinary frequency   . Vitamin B12 deficiency     Past Surgical History:  Procedure Laterality Date  . CARPAL TUNNEL RELEASE  12/11   Right---Dr Burney Gauze  . CHOLECYSTECTOMY    . CYSTOSTOMY W/ BLADDER BIOPSY     multiple  . ESOPHAGOGASTRODUODENOSCOPY    . [place]   right  . PARTIAL NEPHRECTOMY    . TRANSURETHRAL RESECTION OF PROSTATE     limited    Family History  Problem Relation Age of Onset  . Hypertension Mother   . Heart failure Mother   . Stroke Mother   . Lymphoma Son   . Stroke Brother   . Heart attack Brother   . Diabetes Sister   . Stroke Sister   . Colon cancer Neg Hx     Social History   Socioeconomic History  .  Marital status: Married    Spouse name: Not on file  . Number of children: 2  . Years of education: Not on file  . Highest education level: Not on file  Occupational History  . Occupation: retired - Systems developer, still does some produce    Employer: RETIRED  Social Needs  . Financial resource strain: Not on file  . Food insecurity:    Worry: Not on file    Inability: Not on file  . Transportation needs:    Medical: Not on file    Non-medical: Not on file  Tobacco Use  . Smoking status: Former Smoker    Packs/day: 1.00    Years: 40.00    Pack years: 40.00    Types: Cigarettes    Last attempt to quit: 06/25/1981    Years since quitting: 36.7  . Smokeless tobacco: Never Used  Substance and Sexual Activity  . Alcohol use: No  . Drug use: No  . Sexual activity: Not on file  Lifestyle  . Physical activity:    Days per week: Not on file     Minutes per session: Not on file  . Stress: Not on file  Relationships  . Social connections:    Talks on phone: Not on file    Gets together: Not on file    Attends religious service: Not on file    Active member of club or organization: Not on file    Attends meetings of clubs or organizations: Not on file    Relationship status: Not on file  . Intimate partner violence:    Fear of current or ex partner: Not on file    Emotionally abused: Not on file    Physically abused: Not on file    Forced sexual activity: Not on file  Other Topics Concern  . Not on file  Social History Narrative   Has living will.   Asks for wife as health care POA.-- then daughter June Kleven   Requests DNR--done 06/08/16   Not sure about feeding tube            Review of Systems Appetite is not great Weight fairly stable--mostly eating cookies Some back pain if he stands or walks much Bowels are okay--no blood No abdominal pain    Objective:   Physical Exam  Constitutional: No distress.  Neck: No thyromegaly present.  Cardiovascular: Normal rate and regular rhythm. Exam reveals no gallop.  Coarse systolic murmur Some extra beats  Pulmonary/Chest: Effort normal. No respiratory distress. He has no wheezes. He has no rales.  Musculoskeletal:  Calves are thick without pitting  Lymphadenopathy:    He has no cervical adenopathy.  Psychiatric:  Calm and cooperative          Assessment & Plan:

## 2018-03-13 NOTE — Assessment & Plan Note (Signed)
No obvious recurrence Is off the metoprolol--may have to restart if he has any symptoms

## 2018-03-16 DIAGNOSIS — R338 Other retention of urine: Secondary | ICD-10-CM | POA: Diagnosis not present

## 2018-03-27 NOTE — Telephone Encounter (Signed)
[facility] is reviewing your PA request. Typically an electronic response will be received within 72 hours.

## 2018-03-28 ENCOUNTER — Telehealth: Payer: Self-pay | Admitting: Internal Medicine

## 2018-03-28 NOTE — Telephone Encounter (Signed)
Copied from [place] [id number]. Topic: Quick Communication - See Telephone Encounter >> Mar 28, 2018 10:44 AM Arletha Grippe wrote: CRM for notification. See Telephone encounter for: 03/28/18. [facility] called, they will be sending a fax about PA.  They will need a response before 03/30/18 at 11 am, or it may be denied.  Cb is [phone]

## 2018-03-30 NOTE — Telephone Encounter (Signed)
I believe he has Alzheimer's but the visit was the first diagnosis time and I didn't specify See if we can send the PA with G30.1 diagnosis and then it may be approved. It is worth trying

## 2018-03-30 NOTE — Telephone Encounter (Signed)
Medication was denied for the diagnosis of Dementia (unspecified). We can appeal it if needed. The pt's family has never called about the medication.

## 2018-04-16 ENCOUNTER — Telehealth: Payer: Self-pay | Admitting: Internal Medicine

## 2018-04-16 ENCOUNTER — Ambulatory Visit (INDEPENDENT_AMBULATORY_CARE_PROVIDER_SITE_OTHER): Payer: Medicare Other | Admitting: Internal Medicine

## 2018-04-16 ENCOUNTER — Encounter: Payer: Self-pay | Admitting: Internal Medicine

## 2018-04-16 VITALS — BP 138/86 | HR 50 | Ht 68.0 in | Wt 152.0 lb

## 2018-04-16 DIAGNOSIS — R234 Changes in skin texture: Secondary | ICD-10-CM | POA: Diagnosis not present

## 2018-04-16 DIAGNOSIS — F0391 Unspecified dementia with behavioral disturbance: Secondary | ICD-10-CM | POA: Diagnosis not present

## 2018-04-16 MED ORDER — UREA 40 % EX CREA: 1.0000 "application " | TOPICAL_CREAM | Freq: Two times a day (BID) | CUTANEOUS | 3 refills | Status: DC

## 2018-04-16 NOTE — Assessment & Plan Note (Signed)
Will try urea cream

## 2018-04-16 NOTE — Telephone Encounter (Signed)
Copied from [place] [id number]. Topic: Quick Communication - See Telephone Encounter >> Apr 16, 2018  2:34 PM Neva Seat wrote: Pt's cream that was presribed today is $169. Is there another cream that can be prescribed or another cream over the counter.

## 2018-04-16 NOTE — Assessment & Plan Note (Signed)
Seems to be some better with the low dose memantine Will consider increase only if he worsens Daughter retiring to help mom more

## 2018-04-16 NOTE — Progress Notes (Signed)
Subjective:    Patient ID: Bryan David, male    DOB: 1925/04/19, 84 y.o.   MRN: [id number]  HPI Here for follow up of dementia with behavioral problems With daughter and wife  Is taking the memantine Seems to be helping some Wife feels he is okay "if I agree with everything he says" Daughter helping with meals, transportation. She is retiring soon so can help more  Didn't like the under the tongue B12 Feels his diarrhea is better off it  Some blisters and oozing on legs Concern that it is from the memantine  Current Outpatient Medications on File Prior to Visit  Medication Sig Dispense Refill  . aspirin EC 81 MG tablet Take 81 mg by mouth 2 (two) times daily as needed for mild pain.     . memantine (NAMENDA) 5 MG tablet Take 1 tablet (5 mg total) by mouth 2 (two) times daily. 60 tablet 11   No current facility-administered medications on file prior to visit.     Allergies  Allergen Reactions  . Acetaminophen Other (See Comments)    "made him feel silly"    Past Medical History:  Diagnosis Date  . Anxiety   . Arthritis   . Biliary acute pancreatitis   . Bladder cancer ([place])   . Carpal tunnel syndrome   . Choledocholithiasis   . COPD (chronic obstructive pulmonary disease) ([place])   . Dementia 03/13/2018  . Diverticulosis   . Esophageal stricture   . GERD (gastroesophageal reflux disease)   . Hiatal hernia   . Hypertension   . Mood disorder ([place]) 06/08/2016  . Osteoarthritis   . PE (pulmonary embolism)   . Pneumonia   . Pulmonary embolus ([place])   . Renal cancer ([place])   . Shortness of breath   . Urinary frequency   . Vitamin B12 deficiency     Past Surgical History:  Procedure Laterality Date  . CARPAL TUNNEL RELEASE  12/11   Right---Dr Burney Gauze  . CHOLECYSTECTOMY    . CYSTOSTOMY W/ BLADDER BIOPSY     multiple  . ESOPHAGOGASTRODUODENOSCOPY    . [place]   right  . PARTIAL NEPHRECTOMY    . TRANSURETHRAL RESECTION OF PROSTATE     limited    Family History  Problem Relation Age of Onset  . Hypertension Mother   . Heart failure Mother   . Stroke Mother   . Lymphoma Son   . Stroke Brother   . Heart attack Brother   . Diabetes Sister   . Stroke Sister   . Colon cancer Neg Hx     Social History   Socioeconomic History  . Marital status: Married    Spouse name: Not on file  . Number of children: 2  . Years of education: Not on file  . Highest education level: Not on file  Occupational History  . Occupation: retired - Systems developer, still does some produce    Employer: RETIRED  Social Needs  . Financial resource strain: Not on file  . Food insecurity:    Worry: Not on file    Inability: Not on file  . Transportation needs:    Medical: Not on file    Non-medical: Not on file  Tobacco Use  . Smoking status: Former Smoker    Packs/day: 1.00    Years: 40.00    Pack years: 40.00    Types: Cigarettes    Last attempt to quit: 06/25/1981    Years  since quitting: 36.8  . Smokeless tobacco: Never Used  Substance and Sexual Activity  . Alcohol use: No  . Drug use: No  . Sexual activity: Not on file  Lifestyle  . Physical activity:    Days per week: Not on file    Minutes per session: Not on file  . Stress: Not on file  Relationships  . Social connections:    Talks on phone: Not on file    Gets together: Not on file    Attends religious service: Not on file    Active member of club or organization: Not on file    Attends meetings of clubs or organizations: Not on file    Relationship status: Not on file  . Intimate partner violence:    Fear of current or ex partner: Not on file    Emotionally abused: Not on file    Physically abused: Not on file    Forced sexual activity: Not on file  Other Topics Concern  . Not on file  Social History Narrative   Has living will.   Asks for wife as health care POA.-- then daughter June Licht   Requests DNR--done 06/08/16   Not sure about feeding tube              Review of Systems Appetite is not great--though he says it is okay Has lost some weight Sleeping a lot--even in daytime Had been using boost--but now not accepting    Objective:   Physical Exam  Constitutional: No distress.  Cardiovascular:  Faint pulse left foot, absent right  Skin:  Thick yellow scaling over both calves Some draining spots No inflammation Calves are reedy but not really edematous          Assessment & Plan:

## 2018-04-16 NOTE — Telephone Encounter (Signed)
Urea cream 40% (Carmol).Please advise.

## 2018-04-16 NOTE — Telephone Encounter (Signed)
Spoke to Vance at the pharmacy. She said there is no rx comparable to the Urea 40% that is not cheaper. They can order the OTC Urea 20% for $12. Pt just needs to ask them to order it for them.  Spoke to pt's daughter. She will contact the pharmacy.

## 2018-04-16 NOTE — Telephone Encounter (Signed)
Pt was able to get the medication for 90 days. Family will let me now if there is an issue getting it next time.

## 2018-04-16 NOTE — Telephone Encounter (Signed)
Please check with pharmacist to see if there is something cheaper. Otherwise, then can find lower percent urea cream (?20%) OTC

## 2018-04-20 DIAGNOSIS — R338 Other retention of urine: Secondary | ICD-10-CM | POA: Diagnosis not present

## 2018-05-05 ENCOUNTER — Emergency Department (HOSPITAL_COMMUNITY)
Admission: EM | Admit: 2018-05-05 | Discharge: 2018-05-05 | Disposition: A | Discharge status: Home / Self Care | Payer: Medicare Other | Attending: Emergency Medicine | Admitting: Emergency Medicine

## 2018-05-05 ENCOUNTER — Other Ambulatory Visit: Payer: Self-pay

## 2018-05-05 ENCOUNTER — Encounter (HOSPITAL_COMMUNITY): Payer: Self-pay

## 2018-05-05 DIAGNOSIS — T83511A Infection and inflammatory reaction due to indwelling urethral catheter, initial encounter: Secondary | ICD-10-CM | POA: Diagnosis not present

## 2018-05-05 DIAGNOSIS — Z7982 Long term (current) use of aspirin: Secondary | ICD-10-CM | POA: Diagnosis not present

## 2018-05-05 DIAGNOSIS — Z79899 Other long term (current) drug therapy: Secondary | ICD-10-CM | POA: Insufficient documentation

## 2018-05-05 DIAGNOSIS — Z87891 Personal history of nicotine dependence: Secondary | ICD-10-CM | POA: Insufficient documentation

## 2018-05-05 DIAGNOSIS — Y69 Unspecified misadventure during surgical and medical care: Secondary | ICD-10-CM | POA: Insufficient documentation

## 2018-05-05 DIAGNOSIS — J449 Chronic obstructive pulmonary disease, unspecified: Secondary | ICD-10-CM | POA: Insufficient documentation

## 2018-05-05 DIAGNOSIS — I10 Essential (primary) hypertension: Secondary | ICD-10-CM | POA: Insufficient documentation

## 2018-05-05 DIAGNOSIS — Y658 Other specified misadventures during surgical and medical care: Secondary | ICD-10-CM | POA: Insufficient documentation

## 2018-05-05 DIAGNOSIS — F0390 Unspecified dementia without behavioral disturbance: Secondary | ICD-10-CM | POA: Diagnosis not present

## 2018-05-05 DIAGNOSIS — N390 Urinary tract infection, site not specified: Secondary | ICD-10-CM

## 2018-05-05 DIAGNOSIS — T83098A Other mechanical complication of other indwelling urethral catheter, initial encounter: Secondary | ICD-10-CM | POA: Diagnosis not present

## 2018-05-05 DIAGNOSIS — T83091A Other mechanical complication of indwelling urethral catheter, initial encounter: Secondary | ICD-10-CM

## 2018-05-05 LAB — CBC WITH DIFFERENTIAL/PLATELET
ABS IMMATURE GRANULOCYTES: 0 10*3/uL (ref 0.0–0.1)
BASOS ABS: 0.1 10*3/uL (ref 0.0–0.1)
Basophils Relative: 1 %
EOS PCT: 4 %
Eosinophils Absolute: 0.3 10*3/uL (ref 0.0–0.7)
HEMATOCRIT: 46.2 % (ref 39.0–52.0)
Hemoglobin: 14.3 g/dL (ref 13.0–17.0)
IMMATURE GRANULOCYTES: 0 %
LYMPHS ABS: 1.2 10*3/uL (ref 0.7–4.0)
LYMPHS PCT: 14 %
MCH: 27.4 pg (ref 26.0–34.0)
MCHC: 31 g/dL (ref 30.0–36.0)
MCV: 88.5 fL (ref 78.0–100.0)
Monocytes Absolute: 0.8 10*3/uL (ref 0.1–1.0)
Monocytes Relative: 9 %
NEUTROS PCT: 72 %
Neutro Abs: 6.6 10*3/uL (ref 1.7–7.7)
Platelets: 127 10*3/uL — ABNORMAL LOW (ref 150–400)
RBC: 5.22 MIL/uL (ref 4.22–5.81)
RDW: 13.6 % (ref 11.5–15.5)
WBC: 9.1 10*3/uL (ref 4.0–10.5)

## 2018-05-05 LAB — BASIC METABOLIC PANEL
ANION GAP: 8 (ref 5–15)
BUN: 11 mg/dL (ref 6–20)
CALCIUM: 9.3 mg/dL (ref 8.9–10.3)
CO2: 29 mmol/L (ref 22–32)
CREATININE: 1 mg/dL (ref 0.61–1.24)
Chloride: 103 mmol/L (ref 101–111)
Glucose, Bld: 115 mg/dL — ABNORMAL HIGH (ref 65–99)
Potassium: 3.7 mmol/L (ref 3.5–5.1)
SODIUM: 140 mmol/L (ref 135–145)

## 2018-05-05 LAB — URINALYSIS, ROUTINE W REFLEX MICROSCOPIC
BACTERIA UA: NONE SEEN
Bilirubin Urine: NEGATIVE
Glucose, UA: NEGATIVE mg/dL
KETONES UR: NEGATIVE mg/dL
Nitrite: POSITIVE — AB
PROTEIN: 100 mg/dL — AB
Specific Gravity, Urine: 1.016 (ref 1.005–1.030)
WBC, UA: >50 WBC/hpf — ABNORMAL HIGH (ref 0–5)
pH: 7 (ref 5.0–8.0)

## 2018-05-05 MED ORDER — SODIUM CHLORIDE 0.9 % IV SOLN
1.0000 g | Freq: Once | INTRAVENOUS | Status: AC
  Administered 2018-05-05: 1 g via INTRAVENOUS
  Filled 2018-05-05: qty 10

## 2018-05-05 MED ORDER — KETOROLAC TROMETHAMINE 15 MG/ML IJ SOLN
15.0000 mg | Freq: Once | INTRAMUSCULAR | Status: AC
  Administered 2018-05-05: 15 mg via INTRAVENOUS
  Filled 2018-05-05: qty 1

## 2018-05-05 MED ORDER — LACTATED RINGERS IV BOLUS
1000.0000 mL | Freq: Once | INTRAVENOUS | Status: AC
  Administered 2018-05-05: 1000 mL via INTRAVENOUS

## 2018-05-05 MED ORDER — CEPHALEXIN 500 MG PO CAPS: 500.0000 mg | ORAL_CAPSULE | Freq: Four times a day (QID) | ORAL | 0 refills | Status: AC

## 2018-05-05 NOTE — ED Triage Notes (Signed)
Patient here to have foley catheter checked, decreased drainage, no pain. States that they normally change it every 30 days.

## 2018-05-05 NOTE — ED Provider Notes (Signed)
[facility] EMERGENCY DEPARTMENT Provider Note   CSN: [id number] Arrival date & time: 05/05/18  1413     History   Chief Complaint Chief Complaint  Patient presents with  . check foley catheter    HPI Bryan David is a 84 y.o. male.  HPI  Patient is a 84 year old male with a past medical history of bladder cancer, renal cancer with chronic indwelling Foley last changed possibly 15 days ago complaining of decreased urinary output and foul-smelling urine.  Patient has had previous episodes with sediment building up and clogging his Foley catheter removed resulting in suprapubic pain and drainage around Foley.  Patient denies any drainage or pain but is concerned that his Foley is clogged due to little to no output.  Patient denies any fevers chills nausea vomiting abdominal pain pelvic pain back pain.   Past Medical History:  Diagnosis Date  . Anxiety   . Arthritis   . Biliary acute pancreatitis   . Bladder cancer ([place])   . Carpal tunnel syndrome   . Choledocholithiasis   . COPD (chronic obstructive pulmonary disease) ([place])   . Dementia 03/13/2018  . Diverticulosis   . Esophageal stricture   . GERD (gastroesophageal reflux disease)   . Hiatal hernia   . Hypertension   . Mood disorder ([place]) 06/08/2016  . Osteoarthritis   . PE (pulmonary embolism)   . Pneumonia   . Pulmonary embolus ([place])   . Renal cancer ([place])   . Shortness of breath   . Urinary frequency   . Vitamin B12 deficiency     Patient Active Problem List   Diagnosis Date Noted  . Scaling of skin 04/16/2018  . Dementia 03/13/2018  . Renal mass 03/13/2018  . Urinary retention 03/13/2018  . Chronic diarrhea 10/18/2016  . Aortic stenosis 06/08/2016  . Mood disorder ([place]) 06/08/2016  . Advance directive discussed with patient 06/08/2016  . Routine general medical examination at a health care facility 05/13/2014  . Atrial tachycardia ([place]) 06/27/2012  . GERD (gastroesophageal reflux  disease) 09/23/2011  . Osteoarthritis, multiple sites 09/23/2011  . COPD (chronic obstructive pulmonary disease) ([place]) 04/27/2011  . Essential hypertension, benign 10/07/2009    Past Surgical History:  Procedure Laterality Date  . CARPAL TUNNEL RELEASE  12/11   Right---Dr Burney Gauze  . CHOLECYSTECTOMY    . CYSTOSTOMY W/ BLADDER BIOPSY     multiple  . ESOPHAGOGASTRODUODENOSCOPY    . [place]   right  . PARTIAL NEPHRECTOMY    . TRANSURETHRAL RESECTION OF PROSTATE     limited        Home Medications    Prior to Admission medications   Medication Sig Start Date End Date Taking? Authorizing Provider  aspirin EC 81 MG tablet Take 81 mg by mouth 2 (two) times daily as needed for mild pain.     [provider]  cephALEXin (KEFLEX) 500 MG capsule Take 1 capsule (500 mg total) by mouth 4 (four) times daily for 10 days. 05/05/18 05/15/18  Chapman Moss, MD  memantine (NAMENDA) 5 MG tablet Take 1 tablet (5 mg total) by mouth 2 (two) times daily. 03/13/18   Venia Carbon, MD  urea (CARMOL) 40 % CREA Apply 1 application topically 2 (two) times daily. 04/16/18   Venia Carbon, MD    Family History Family History  Problem Relation Age of Onset  . Hypertension Mother   . Heart failure Mother   . Stroke Mother   .  Lymphoma Son   . Stroke Brother   . Heart attack Brother   . Diabetes Sister   . Stroke Sister   . Colon cancer Neg Hx     Social History Social History   Tobacco Use  . Smoking status: Former Smoker    Packs/day: 1.00    Years: 40.00    Pack years: 40.00    Types: Cigarettes    Last attempt to quit: 06/25/1981    Years since quitting: 36.8  . Smokeless tobacco: Never Used  Substance Use Topics  . Alcohol use: No  . Drug use: No     Allergies   Acetaminophen   Review of Systems Review of Systems  Respiratory: Negative for shortness of breath.   Cardiovascular: Negative for chest pain.  Gastrointestinal: Negative for  abdominal pain.  Genitourinary: Positive for decreased urine volume. Negative for discharge, flank pain, frequency, hematuria, penile pain, penile swelling, scrotal swelling and testicular pain.  All other systems reviewed and are negative.    Physical Exam Updated Vital Signs BP 134/85   Pulse 81   Temp (!) 97.5 F (36.4 C) (Oral)   Resp 16   SpO2 96%   Physical Exam  Constitutional: He appears well-developed and well-nourished.  HENT:  Head: Normocephalic and atraumatic.  Eyes: Conjunctivae are normal.  Neck: Neck supple.  Cardiovascular: Normal rate and regular rhythm.  No murmur heard. Pulmonary/Chest: Effort normal and breath sounds normal. No respiratory distress.  Abdominal: Soft. There is no tenderness.  Genitourinary: Penis normal. No penile tenderness.  Musculoskeletal: He exhibits no edema.  Neurological: He is alert.  Skin: Skin is warm and dry.  Psychiatric: He has a normal mood and affect.  Nursing note and vitals reviewed.    ED Treatments / Results  Labs (all labs ordered are listed, but only abnormal results are displayed) Labs Reviewed  URINE CULTURE - Abnormal; Notable for the following components:      Result Value   Culture   (*)    Value: >=100,000 COLONIES/mL ESCHERICHIA COLI SUSCEPTIBILITIES TO FOLLOW Performed at [facility], [address], [place], [place] [address]    All other components within normal limits  CBC WITH DIFFERENTIAL/PLATELET - Abnormal; Notable for the following components:   Platelets 127 (*)    All other components within normal limits  URINALYSIS, ROUTINE W REFLEX MICROSCOPIC - Abnormal; Notable for the following components:   Color, Urine AMBER (*)    APPearance CLOUDY (*)    Hgb urine dipstick LARGE (*)    Protein, ur 100 (*)    Nitrite POSITIVE (*)    Leukocytes, UA LARGE (*)    RBC / HPF >50 (*)    WBC, UA >50 (*)    All other components within normal limits  BASIC METABOLIC PANEL - Abnormal; Notable  for the following components:   Glucose, Bld 115 (*)    All other components within normal limits    EKG None  Radiology No results found.  Procedures Procedures (including critical care time)  Medications Ordered in ED Medications  cefTRIAXone (ROCEPHIN) 1 g in sodium chloride 0.9 % 100 mL IVPB (0 g Intravenous Stopped 05/05/18 1952)  lactated ringers bolus 1,000 mL (0 mLs Intravenous Stopped 05/05/18 1931)  ketorolac (TORADOL) 15 MG/ML injection 15 mg (15 mg Intravenous Given 05/05/18 2006)     Initial Impression / Assessment and Plan / ED Course  I have reviewed the triage vital signs and the nursing notes.  Pertinent  labs & imaging results that were available during my care of the patient were reviewed by me and considered in my medical decision making (see chart for details).     Patient's urine concerning for infection.  Patient given 1 g Rocephin IV.  Will ensure patient is discharged with appropriate antibiotics for continued therapy.  Foley catheter was changed and urine culture was sent.  Patient with normal creatinine, afebrile with no leukocytosis.  No CVA tenderness on exam.  Believe patient be suffering from urinary tract infection resulting in increased sediment in the urine.  She patient instructed to follow-up closely with his urologist. Pt given appropriate f/u and return precautions. Pt voiced understanding and is agreeable to discharge at this time.    Final Clinical Impressions(s) / ED Diagnoses   Final diagnoses:  Obstruction of Foley catheter, initial encounter ([place])  Urinary tract infection associated with indwelling urethral catheter, initial encounter [facility])    ED Discharge Orders        Ordered    cephALEXin (KEFLEX) 500 MG capsule  4 times daily     05/05/18 2046       Chapman Moss, MD 05/06/18 6945    Elnora Morrison, MD 05/07/18 [phone]

## 2018-05-07 LAB — URINE CULTURE

## 2018-05-08 ENCOUNTER — Telehealth: Payer: Self-pay | Admitting: *Deleted

## 2018-05-08 NOTE — Telephone Encounter (Signed)
Post ED Visit - Positive Culture Follow-up  Culture report reviewed by antimicrobial stewardship pharmacist:  []  Elenor Quinones, Pharm.D. []  Heide Guile, Pharm.D., BCPS AQ-ID []  Parks Neptune, Pharm.D., BCPS []  Alycia Rossetti, Pharm.D., BCPS []  Gonzales, [place].D., BCPS, AAHIVP []  Legrand Como, Pharm.D., BCPS, AAHIVP []  Salome Arnt, PharmD, BCPS []  Wynell Balloon, PharmD []  Vincenza Hews, PharmD, BCPS [facility]. Pharm D  Positive urine culture treated with Cephalexin, organism sensitive to the same and no further patient follow-up is required at this time.  Harlon Flor [facility] 05/08/2018, 10:16 AM

## 2018-05-09 ENCOUNTER — Telehealth: Payer: Self-pay

## 2018-05-09 NOTE — Telephone Encounter (Signed)
Spoke to pt's wife. She said someone else is flushing it. She is not doing it. She will ask the urologist about training her on flushing it.

## 2018-06-04 DIAGNOSIS — R338 Other retention of urine: Secondary | ICD-10-CM | POA: Diagnosis not present

## 2018-06-27 ENCOUNTER — Encounter: Payer: Medicare Other | Admitting: Internal Medicine

## 2018-07-05 ENCOUNTER — Encounter: Payer: Medicare Other | Admitting: Internal Medicine

## 2018-07-06 DIAGNOSIS — R338 Other retention of urine: Secondary | ICD-10-CM | POA: Diagnosis not present

## 2018-07-13 ENCOUNTER — Emergency Department (HOSPITAL_COMMUNITY)
Admission: EM | Admit: 2018-07-13 | Discharge: 2018-07-13 | Disposition: A | Discharge status: Home / Self Care | Payer: Medicare Other | Attending: Emergency Medicine | Admitting: Emergency Medicine

## 2018-07-13 ENCOUNTER — Encounter (HOSPITAL_COMMUNITY): Payer: Self-pay | Admitting: Emergency Medicine

## 2018-07-13 ENCOUNTER — Encounter: Payer: Medicare Other | Admitting: Internal Medicine

## 2018-07-13 ENCOUNTER — Telehealth: Payer: Self-pay | Admitting: *Deleted

## 2018-07-13 DIAGNOSIS — Z87891 Personal history of nicotine dependence: Secondary | ICD-10-CM | POA: Diagnosis not present

## 2018-07-13 DIAGNOSIS — R109 Unspecified abdominal pain: Secondary | ICD-10-CM | POA: Diagnosis not present

## 2018-07-13 DIAGNOSIS — T83098A Other mechanical complication of other indwelling urethral catheter, initial encounter: Secondary | ICD-10-CM | POA: Diagnosis not present

## 2018-07-13 DIAGNOSIS — Z79899 Other long term (current) drug therapy: Secondary | ICD-10-CM | POA: Insufficient documentation

## 2018-07-13 DIAGNOSIS — R338 Other retention of urine: Secondary | ICD-10-CM

## 2018-07-13 DIAGNOSIS — F0390 Unspecified dementia without behavioral disturbance: Secondary | ICD-10-CM | POA: Diagnosis not present

## 2018-07-13 DIAGNOSIS — Z436 Encounter for attention to other artificial openings of urinary tract: Secondary | ICD-10-CM | POA: Diagnosis present

## 2018-07-13 DIAGNOSIS — Y828 Other medical devices associated with adverse incidents: Secondary | ICD-10-CM | POA: Insufficient documentation

## 2018-07-13 DIAGNOSIS — T83091A Other mechanical complication of indwelling urethral catheter, initial encounter: Secondary | ICD-10-CM | POA: Diagnosis not present

## 2018-07-13 DIAGNOSIS — R339 Retention of urine, unspecified: Secondary | ICD-10-CM | POA: Insufficient documentation

## 2018-07-13 LAB — BASIC METABOLIC PANEL
ANION GAP: 15 (ref 5–15)
BUN: 17 mg/dL (ref 8–23)
CO2: 25 mmol/L (ref 22–32)
Calcium: 9.5 mg/dL (ref 8.9–10.3)
Chloride: 98 mmol/L (ref 98–111)
Creatinine, Ser: 1.24 mg/dL (ref 0.61–1.24)
GFR calc Af Amer: 57 mL/min — ABNORMAL LOW (ref >60)
GFR calc non Af Amer: 49 mL/min — ABNORMAL LOW (ref >60)
GLUCOSE: 132 mg/dL — AB (ref 70–99)
POTASSIUM: 4.3 mmol/L (ref 3.5–5.1)
Sodium: 138 mmol/L (ref 135–145)

## 2018-07-13 LAB — URINALYSIS, ROUTINE W REFLEX MICROSCOPIC
BILIRUBIN URINE: NEGATIVE
Glucose, UA: NEGATIVE mg/dL
Ketones, ur: NEGATIVE mg/dL
NITRITE: POSITIVE — AB
PH: 8 (ref 5.0–8.0)
Protein, ur: 100 mg/dL — AB
SPECIFIC GRAVITY, URINE: 1.013 (ref 1.005–1.030)

## 2018-07-13 LAB — CBC
HEMATOCRIT: 47.3 % (ref 39.0–52.0)
Hemoglobin: 14.9 g/dL (ref 13.0–17.0)
MCH: 27.4 pg (ref 26.0–34.0)
MCHC: 31.5 g/dL (ref 30.0–36.0)
MCV: 87.1 fL (ref 78.0–100.0)
Platelets: 184 10*3/uL (ref 150–400)
RBC: 5.43 MIL/uL (ref 4.22–5.81)
RDW: 13.9 % (ref 11.5–15.5)
WBC: 12 10*3/uL — AB (ref 4.0–10.5)

## 2018-07-13 MED ORDER — MORPHINE SULFATE (PF) 4 MG/ML IV SOLN
4.0000 mg | Freq: Once | INTRAVENOUS | Status: AC
  Administered 2018-07-13: 4 mg via INTRAVENOUS
  Filled 2018-07-13: qty 1

## 2018-07-13 MED ORDER — CEPHALEXIN 250 MG/5ML PO SUSR: 500.0000 mg | Freq: Three times a day (TID) | ORAL | 0 refills | Status: AC

## 2018-07-13 MED ORDER — SODIUM CHLORIDE 0.9 % IV SOLN
1.0000 g | Freq: Once | INTRAVENOUS | Status: AC
  Administered 2018-07-13: 1 g via INTRAVENOUS
  Filled 2018-07-13: qty 10

## 2018-07-13 MED ORDER — CEPHALEXIN 500 MG PO CAPS: 500.0000 mg | ORAL_CAPSULE | Freq: Three times a day (TID) | ORAL | 0 refills | Status: DC

## 2018-07-13 NOTE — ED Triage Notes (Signed)
Patient reports bladder pressure / leaking urine at insertion ( foley catheter) site this evening , foley catheter changed last month at urologist clinic .

## 2018-07-13 NOTE — ED Provider Notes (Signed)
[facility]MENT Provider Note   CSN: [id number] Arrival date & time: 07/13/18  0026     History   Chief Complaint Chief Complaint  Patient presents with  . Foley Catheter Clogged   Level 5 caveat: Dementia  HPI [name] is a 84 y.o. male.  HPI Patient is a 84 year old male presents the emergency department with decreased urine output today and his chronic indwelling catheter.  He states he has not had into the bag all day.  No fevers or chills.  No nausea or vomiting.  Reports severe suprapubic abdominal discomfort at this time.  No altered mental status.  History of dementia.  Family provides a significant amount of history.   Past Medical History:  Diagnosis Date  . Anxiety   . Arthritis   . Biliary acute pancreatitis   . Bladder cancer ([place])   . Carpal tunnel syndrome   . Choledocholithiasis   . COPD (chronic obstructive pulmonary disease) ([place])   . Dementia 03/13/2018  . Diverticulosis   . Esophageal stricture   . GERD (gastroesophageal reflux disease)   . Hiatal hernia   . Hypertension   . Mood disorder ([place]) 06/08/2016  . Osteoarthritis   . PE (pulmonary embolism)   . Pneumonia   . Pulmonary embolus ([place])   . Renal cancer ([facility])   . Shortness of breath   . Urinary frequency   . Vitamin B12 deficiency     Patient Active Problem List   Diagnosis Date Noted  . Scaling of skin 04/16/2018  . Dementia 03/13/2018  . Renal mass 03/13/2018  . Urinary retention 03/13/2018  . Chronic diarrhea 10/18/2016  . Aortic stenosis 06/08/2016  . Mood disorder ([place]) 06/08/2016  . Advance directive discussed with patient 06/08/2016  . Routine general medical examination at a health care facility 05/13/2014  . Atrial tachycardia ([place]) 06/27/2012  . GERD (gastroesophageal reflux disease) 09/23/2011  . Osteoarthritis, multiple sites 09/23/2011  . COPD (chronic obstructive pulmonary disease) ([place]) 04/27/2011  . Essential hypertension,  benign 10/07/2009    Past Surgical History:  Procedure Laterality Date  . CARPAL TUNNEL RELEASE  12/11   Right---Dr Burney Gauze  . CHOLECYSTECTOMY    . CYSTOSTOMY W/ BLADDER BIOPSY     multiple  . ESOPHAGOGASTRODUODENOSCOPY    . [place]   right  . PARTIAL NEPHRECTOMY    . TRANSURETHRAL RESECTION OF PROSTATE     limited        Home Medications    Prior to Admission medications   Medication Sig Start Date End Date Taking? Authorizing Provider  cephALEXin (KEFLEX) 250 MG/5ML suspension Take 10 mLs (500 mg total) by mouth 3 (three) times daily for 7 days. 07/13/18 07/20/18  Jola Schmidt, MD  cephALEXin (KEFLEX) 500 MG capsule Take 1 capsule (500 mg total) by mouth 3 (three) times daily. 07/13/18   Jola Schmidt, MD  memantine (NAMENDA) 5 MG tablet Take 1 tablet (5 mg total) by mouth 2 (two) times daily. Patient not taking: Reported on 07/13/2018 03/13/18   Viviana Simpler I, MD  urea (CARMOL) 40 % CREA Apply 1 application topically 2 (two) times daily. Patient not taking: Reported on 07/13/2018 04/16/18   Venia Carbon, MD    Family History Family History  Problem Relation Age of Onset  . Hypertension Mother   . Heart failure Mother   . Stroke Mother   . Lymphoma Son   . Stroke Brother   . Heart attack  Brother   . Diabetes Sister   . Stroke Sister   . Colon cancer Neg Hx     Social History Social History   Tobacco Use  . Smoking status: Former Smoker    Packs/day: 1.00    Years: 40.00    Pack years: 40.00    Types: Cigarettes    Last attempt to quit: 06/25/1981    Years since quitting: 37.0  . Smokeless tobacco: Never Used  Substance Use Topics  . Alcohol use: No  . Drug use: No     Allergies   Acetaminophen   Review of Systems Review of Systems  Unable to perform ROS: Dementia     Physical Exam Updated Vital Signs BP 105/85   Pulse 81   Temp 97.6 F (36.4 C) (Oral)   Resp 16   Ht 6\' 1"  (1.854 m)   Wt 66.2 kg   SpO2 94%   BMI  19.26 kg/m   Physical Exam  Constitutional: He appears well-developed and well-nourished.  HENT:  Head: Normocephalic and atraumatic.  Eyes: EOM are normal.  Neck: Normal range of motion.  Cardiovascular: Normal rate and regular rhythm.  Pulmonary/Chest: Effort normal and breath sounds normal. No respiratory distress.  Abdominal: Soft. He exhibits no distension. There is no tenderness.  Genitourinary:  Genitourinary Comments: Foley catheter in place.  Musculoskeletal: Normal range of motion.  Neurological: He is alert.  Skin: Skin is warm and dry.  Psychiatric: He has a normal mood and affect. Judgment normal.  Nursing note and vitals reviewed.    ED Treatments / Results  Labs (all labs ordered are listed, but only abnormal results are displayed) Labs Reviewed  URINALYSIS, ROUTINE W REFLEX MICROSCOPIC - Abnormal; Notable for the following components:      Result Value   APPearance TURBID (*)    Hgb urine dipstick MODERATE (*)    Protein, ur 100 (*)    Nitrite POSITIVE (*)    Leukocytes, UA LARGE (*)    RBC / HPF >50 (*)    WBC, UA >50 (*)    Bacteria, UA MANY (*)    All other components within normal limits  CBC - Abnormal; Notable for the following components:   WBC 12.0 (*)    All other components within normal limits  BASIC METABOLIC PANEL - Abnormal; Notable for the following components:   Glucose, Bld 132 (*)    GFR calc non Af Amer 49 (*)    GFR calc Af Amer 57 (*)    All other components within normal limits  URINE CULTURE    EKG None  Radiology No results found.  Procedures Procedures (including critical care time)  Medications Ordered in ED Medications  morphine 4 MG/ML injection 4 mg (4 mg Intravenous Given 07/13/18 0514)  cefTRIAXone (ROCEPHIN) 1 g in sodium chloride 0.9 % 100 mL IVPB (1 g Intravenous New Bag/Given 07/13/18 0559)     Initial Impression / Assessment and Plan / ED Course  I have reviewed the triage vital signs and the nursing  notes.  Pertinent labs & imaging results that were available during my care of the patient were reviewed by me and considered in my medical decision making (see chart for details).     Catheter exchange in 750 cc of urine was released.  Patient's pain improved.  No indication for CT imaging at this time given the significant amount of pain relief with drainage of his bladder.  Urine culture sent.  The urine does  appear cloudy.  He will be started on antibiotics for possible urinary tract infection which could have led to the urinary obstruction given clogging of the urinary catheter.  Family wishes to have both liquid and pill options available in terms of antibiotics given the patient's somewhat resistant nature to taking oral medications.  They will work with her primary care physician and the pharmacist to determine the best appropriate medication.  Final Clinical Impressions(s) / ED Diagnoses   Final diagnoses:  Acute abdominal pain  Acute urinary retention  Other mechanical complication of indwelling urethral catheter, initial encounter [facility])    ED Discharge Orders         Ordered    cephALEXin (KEFLEX) 500 MG capsule  3 times daily     07/13/18 0633    cephALEXin (KEFLEX) 250 MG/5ML suspension  3 times daily     07/13/18 9528           Jola Schmidt, MD 07/13/18 [phone]

## 2018-07-13 NOTE — Telephone Encounter (Signed)
Pharmacy called related to Rx: Keflex dispense amount .Marland KitchenMarland KitchenEDCM clarified with EDP (Kirchenko) to change Rx to: dispense 210 mL.

## 2018-07-15 LAB — URINE CULTURE

## 2018-07-16 ENCOUNTER — Telehealth: Payer: Self-pay | Admitting: Emergency Medicine

## 2018-07-16 NOTE — Telephone Encounter (Signed)
Post ED Visit - Positive Culture Follow-up  Culture report reviewed by antimicrobial stewardship pharmacist:  []  Elenor Quinones, Pharm.D. []  Heide Guile, Pharm.D., BCPS AQ-ID []  Parks Neptune, Pharm.D., BCPS []  Alycia Rossetti, Pharm.D., BCPS []  [place], [place].D., BCPS, AAHIVP []  Legrand Como, Pharm.D., BCPS, AAHIVP []  Salome Arnt, PharmD, BCPS []  Johnnette Gourd, PharmD, BCPS []  Hughes Better, PharmD, BCPS []  Leeroy Cha, PharmD Marzetta Merino PharmD  Positive urine culture Treated with cephalexin, organism sensitive to the same and no further patient follow-up is required at this time.  [name] 07/16/2018, 10:50 AM

## 2018-08-10 DIAGNOSIS — R338 Other retention of urine: Secondary | ICD-10-CM | POA: Diagnosis not present

## 2018-08-22 DIAGNOSIS — R338 Other retention of urine: Secondary | ICD-10-CM | POA: Diagnosis not present

## 2018-09-11 DIAGNOSIS — R338 Other retention of urine: Secondary | ICD-10-CM | POA: Diagnosis not present

## 2018-09-28 ENCOUNTER — Other Ambulatory Visit: Payer: Self-pay

## 2018-09-28 ENCOUNTER — Encounter (HOSPITAL_COMMUNITY): Payer: Self-pay

## 2018-09-28 ENCOUNTER — Emergency Department (HOSPITAL_COMMUNITY): Payer: Medicare Other

## 2018-09-28 ENCOUNTER — Inpatient Hospital Stay (HOSPITAL_COMMUNITY)
Admission: EM | Admit: 2018-09-28 | Discharge: 2018-10-02 | DRG: 698 | Payer: Medicare Other | Attending: Internal Medicine | Admitting: Internal Medicine

## 2018-09-28 ENCOUNTER — Inpatient Hospital Stay (HOSPITAL_COMMUNITY): Payer: Medicare Other

## 2018-09-28 DIAGNOSIS — G9341 Metabolic encephalopathy: Secondary | ICD-10-CM | POA: Diagnosis not present

## 2018-09-28 DIAGNOSIS — Y846 Urinary catheterization as the cause of abnormal reaction of the patient, or of later complication, without mention of misadventure at the time of the procedure: Secondary | ICD-10-CM | POA: Diagnosis present

## 2018-09-28 DIAGNOSIS — J9811 Atelectasis: Secondary | ICD-10-CM | POA: Diagnosis present

## 2018-09-28 DIAGNOSIS — F0390 Unspecified dementia without behavioral disturbance: Secondary | ICD-10-CM | POA: Diagnosis present

## 2018-09-28 DIAGNOSIS — F419 Anxiety disorder, unspecified: Secondary | ICD-10-CM | POA: Diagnosis present

## 2018-09-28 DIAGNOSIS — J189 Pneumonia, unspecified organism: Secondary | ICD-10-CM | POA: Diagnosis present

## 2018-09-28 DIAGNOSIS — E872 Acidosis, unspecified: Secondary | ICD-10-CM | POA: Diagnosis present

## 2018-09-28 DIAGNOSIS — Z66 Do not resuscitate: Secondary | ICD-10-CM | POA: Diagnosis not present

## 2018-09-28 DIAGNOSIS — I429 Cardiomyopathy, unspecified: Secondary | ICD-10-CM | POA: Diagnosis not present

## 2018-09-28 DIAGNOSIS — I4891 Unspecified atrial fibrillation: Secondary | ICD-10-CM | POA: Diagnosis not present

## 2018-09-28 DIAGNOSIS — R0902 Hypoxemia: Secondary | ICD-10-CM | POA: Diagnosis present

## 2018-09-28 DIAGNOSIS — I361 Nonrheumatic tricuspid (valve) insufficiency: Secondary | ICD-10-CM | POA: Diagnosis not present

## 2018-09-28 DIAGNOSIS — I110 Hypertensive heart disease with heart failure: Secondary | ICD-10-CM | POA: Diagnosis present

## 2018-09-28 DIAGNOSIS — F05 Delirium due to known physiological condition: Secondary | ICD-10-CM | POA: Diagnosis present

## 2018-09-28 DIAGNOSIS — B353 Tinea pedis: Secondary | ICD-10-CM | POA: Diagnosis present

## 2018-09-28 DIAGNOSIS — I10 Essential (primary) hypertension: Secondary | ICD-10-CM | POA: Diagnosis present

## 2018-09-28 DIAGNOSIS — Z743 Need for continuous supervision: Secondary | ICD-10-CM | POA: Diagnosis not present

## 2018-09-28 DIAGNOSIS — J440 Chronic obstructive pulmonary disease with acute lower respiratory infection: Secondary | ICD-10-CM | POA: Diagnosis not present

## 2018-09-28 DIAGNOSIS — N179 Acute kidney failure, unspecified: Secondary | ICD-10-CM | POA: Diagnosis not present

## 2018-09-28 DIAGNOSIS — K219 Gastro-esophageal reflux disease without esophagitis: Secondary | ICD-10-CM | POA: Diagnosis present

## 2018-09-28 DIAGNOSIS — R079 Chest pain, unspecified: Secondary | ICD-10-CM | POA: Diagnosis not present

## 2018-09-28 DIAGNOSIS — E43 Unspecified severe protein-calorie malnutrition: Secondary | ICD-10-CM

## 2018-09-28 DIAGNOSIS — Z86711 Personal history of pulmonary embolism: Secondary | ICD-10-CM

## 2018-09-28 DIAGNOSIS — I340 Nonrheumatic mitral (valve) insufficiency: Secondary | ICD-10-CM

## 2018-09-28 DIAGNOSIS — A419 Sepsis, unspecified organism: Secondary | ICD-10-CM | POA: Diagnosis not present

## 2018-09-28 DIAGNOSIS — D61818 Other pancytopenia: Secondary | ICD-10-CM | POA: Diagnosis not present

## 2018-09-28 DIAGNOSIS — I480 Paroxysmal atrial fibrillation: Secondary | ICD-10-CM | POA: Diagnosis present

## 2018-09-28 DIAGNOSIS — Z8551 Personal history of malignant neoplasm of bladder: Secondary | ICD-10-CM

## 2018-09-28 DIAGNOSIS — B351 Tinea unguium: Secondary | ICD-10-CM | POA: Diagnosis not present

## 2018-09-28 DIAGNOSIS — T83511A Infection and inflammatory reaction due to indwelling urethral catheter, initial encounter: Principal | ICD-10-CM | POA: Diagnosis present

## 2018-09-28 DIAGNOSIS — I5021 Acute systolic (congestive) heart failure: Secondary | ICD-10-CM | POA: Diagnosis not present

## 2018-09-28 DIAGNOSIS — F39 Unspecified mood [affective] disorder: Secondary | ICD-10-CM | POA: Diagnosis present

## 2018-09-28 DIAGNOSIS — J90 Pleural effusion, not elsewhere classified: Secondary | ICD-10-CM | POA: Diagnosis not present

## 2018-09-28 DIAGNOSIS — G309 Alzheimer's disease, unspecified: Secondary | ICD-10-CM | POA: Diagnosis present

## 2018-09-28 DIAGNOSIS — R0789 Other chest pain: Secondary | ICD-10-CM | POA: Diagnosis not present

## 2018-09-28 DIAGNOSIS — J449 Chronic obstructive pulmonary disease, unspecified: Secondary | ICD-10-CM | POA: Diagnosis present

## 2018-09-28 DIAGNOSIS — N390 Urinary tract infection, site not specified: Secondary | ICD-10-CM | POA: Diagnosis present

## 2018-09-28 DIAGNOSIS — N400 Enlarged prostate without lower urinary tract symptoms: Secondary | ICD-10-CM | POA: Diagnosis present

## 2018-09-28 DIAGNOSIS — R739 Hyperglycemia, unspecified: Secondary | ICD-10-CM

## 2018-09-28 DIAGNOSIS — Z87891 Personal history of nicotine dependence: Secondary | ICD-10-CM | POA: Diagnosis not present

## 2018-09-28 DIAGNOSIS — I499 Cardiac arrhythmia, unspecified: Secondary | ICD-10-CM | POA: Diagnosis not present

## 2018-09-28 DIAGNOSIS — F0280 Dementia in other diseases classified elsewhere without behavioral disturbance: Secondary | ICD-10-CM | POA: Diagnosis present

## 2018-09-28 DIAGNOSIS — Y95 Nosocomial condition: Secondary | ICD-10-CM | POA: Diagnosis present

## 2018-09-28 DIAGNOSIS — Z85528 Personal history of other malignant neoplasm of kidney: Secondary | ICD-10-CM

## 2018-09-28 LAB — BASIC METABOLIC PANEL
Anion gap: 9 (ref 5–15)
BUN: 11 mg/dL (ref 8–23)
CALCIUM: 8.8 mg/dL — AB (ref 8.9–10.3)
CO2: 26 mmol/L (ref 22–32)
CREATININE: 1.11 mg/dL (ref 0.61–1.24)
Chloride: 103 mmol/L (ref 98–111)
GFR calc Af Amer: >60 mL/min (ref >60)
GFR, EST NON AFRICAN AMERICAN: 56 mL/min — AB (ref >60)
GLUCOSE: 215 mg/dL — AB (ref 70–99)
Potassium: 3.5 mmol/L (ref 3.5–5.1)
Sodium: 138 mmol/L (ref 135–145)

## 2018-09-28 LAB — CBC WITH DIFFERENTIAL/PLATELET
Abs Immature Granulocytes: 0.05 10*3/uL (ref 0.00–0.07)
BASOS PCT: 1 %
Basophils Absolute: 0.1 10*3/uL (ref 0.0–0.1)
EOS ABS: 0.1 10*3/uL (ref 0.0–0.5)
Eosinophils Relative: 1 %
HEMATOCRIT: 45.8 % (ref 39.0–52.0)
Hemoglobin: 14 g/dL (ref 13.0–17.0)
IMMATURE GRANULOCYTES: 0 %
LYMPHS ABS: 0.8 10*3/uL (ref 0.7–4.0)
Lymphocytes Relative: 6 %
MCH: 27.1 pg (ref 26.0–34.0)
MCHC: 30.6 g/dL (ref 30.0–36.0)
MCV: 88.8 fL (ref 80.0–100.0)
MONO ABS: 0.6 10*3/uL (ref 0.1–1.0)
MONOS PCT: 5 %
NEUTROS PCT: 87 %
Neutro Abs: 11.1 10*3/uL — ABNORMAL HIGH (ref 1.7–7.7)
PLATELETS: 144 10*3/uL — AB (ref 150–400)
RBC: 5.16 MIL/uL (ref 4.22–5.81)
RDW: 14.6 % (ref 11.5–15.5)
WBC: 12.7 10*3/uL — ABNORMAL HIGH (ref 4.0–10.5)
nRBC: 0 % (ref 0.0–0.2)

## 2018-09-28 LAB — ECHOCARDIOGRAM COMPLETE
Height: 73 in
Weight: 2432 oz

## 2018-09-28 LAB — PROTIME-INR
INR: 1.25
Prothrombin Time: 15.6 seconds — ABNORMAL HIGH (ref 11.4–15.2)

## 2018-09-28 LAB — URINALYSIS, ROUTINE W REFLEX MICROSCOPIC
BILIRUBIN URINE: NEGATIVE
Glucose, UA: NEGATIVE mg/dL
KETONES UR: NEGATIVE mg/dL
NITRITE: NEGATIVE
PH: 7 (ref 5.0–8.0)
Protein, ur: 30 mg/dL — AB
SPECIFIC GRAVITY, URINE: 1.01 (ref 1.005–1.030)

## 2018-09-28 LAB — MAGNESIUM
MAGNESIUM: 2.4 mg/dL (ref 1.7–2.4)
Magnesium: 2.2 mg/dL (ref 1.7–2.4)

## 2018-09-28 LAB — I-STAT CHEM 8, ED
BUN: 14 mg/dL (ref 8–23)
CHLORIDE: 99 mmol/L (ref 98–111)
Calcium, Ion: 1.11 mmol/L — ABNORMAL LOW (ref 1.15–1.40)
Creatinine, Ser: 0.9 mg/dL (ref 0.61–1.24)
Glucose, Bld: 172 mg/dL — ABNORMAL HIGH (ref 70–99)
HCT: 46 % (ref 39.0–52.0)
HEMOGLOBIN: 15.6 g/dL (ref 13.0–17.0)
POTASSIUM: 3.6 mmol/L (ref 3.5–5.1)
SODIUM: 139 mmol/L (ref 135–145)
TCO2: 30 mmol/L (ref 22–32)

## 2018-09-28 LAB — LACTIC ACID, PLASMA: Lactic Acid, Venous: 1.7 mmol/L (ref 0.5–1.9)

## 2018-09-28 LAB — TROPONIN I
TROPONIN I: 0.05 ng/mL — AB (ref <0.03)
Troponin I: 0.07 ng/mL (ref <0.03)
Troponin I: 0.08 ng/mL (ref <0.03)

## 2018-09-28 LAB — STREP PNEUMONIAE URINARY ANTIGEN: Strep Pneumo Urinary Antigen: NEGATIVE

## 2018-09-28 LAB — I-STAT CG4 LACTIC ACID, ED
Lactic Acid, Venous: 2.41 mmol/L (ref 0.5–1.9)
Lactic Acid, Venous: 2.54 mmol/L (ref 0.5–1.9)

## 2018-09-28 LAB — GLUCOSE, CAPILLARY
GLUCOSE-CAPILLARY: 93 mg/dL (ref 70–99)
Glucose-Capillary: 97 mg/dL (ref 70–99)

## 2018-09-28 LAB — BRAIN NATRIURETIC PEPTIDE: B NATRIURETIC PEPTIDE 5: 667.9 pg/mL — AB (ref 0.0–100.0)

## 2018-09-28 LAB — PHOSPHORUS: PHOSPHORUS: 2.9 mg/dL (ref 2.5–4.6)

## 2018-09-28 LAB — CBG MONITORING, ED: Glucose-Capillary: 97 mg/dL (ref 70–99)

## 2018-09-28 MED ORDER — SODIUM CHLORIDE 0.9 % IV BOLUS (SEPSIS)
1000.0000 mL | Freq: Once | INTRAVENOUS | Status: AC
  Administered 2018-09-28: 1000 mL via INTRAVENOUS

## 2018-09-28 MED ORDER — SODIUM CHLORIDE 0.9 % IV SOLN
1.0000 g | Freq: Three times a day (TID) | INTRAVENOUS | Status: DC
  Administered 2018-09-28 – 2018-09-29 (×2): 1 g via INTRAVENOUS
  Filled 2018-09-28 (×4): qty 1

## 2018-09-28 MED ORDER — ASPIRIN EC 81 MG PO TBEC
81.0000 mg | DELAYED_RELEASE_TABLET | Freq: Every day | ORAL | Status: DC
  Filled 2018-09-28: qty 1

## 2018-09-28 MED ORDER — HALOPERIDOL LACTATE 5 MG/ML IJ SOLN: 2.0000 mg | Freq: Once | INTRAMUSCULAR | Status: DC

## 2018-09-28 MED ORDER — VANCOMYCIN HCL IN DEXTROSE 1-5 GM/200ML-% IV SOLN
1000.0000 mg | Freq: Once | INTRAVENOUS | Status: AC
  Administered 2018-09-28: 1000 mg via INTRAVENOUS
  Filled 2018-09-28: qty 200

## 2018-09-28 MED ORDER — HEPARIN SODIUM (PORCINE) 5000 UNIT/ML IJ SOLN
5000.0000 [IU] | Freq: Three times a day (TID) | INTRAMUSCULAR | Status: DC
  Administered 2018-09-28 – 2018-09-30 (×5): 5000 [IU] via SUBCUTANEOUS
  Filled 2018-09-28 (×5): qty 1

## 2018-09-28 MED ORDER — INSULIN ASPART 100 UNIT/ML ~~LOC~~ SOLN: 0.0000 [IU] | Freq: Three times a day (TID) | SUBCUTANEOUS | Status: DC

## 2018-09-28 MED ORDER — HALOPERIDOL LACTATE 5 MG/ML IJ SOLN
2.5000 mg | Freq: Once | INTRAMUSCULAR | Status: AC
  Administered 2018-09-28: 2.5 mg via INTRAVENOUS
  Filled 2018-09-28: qty 1

## 2018-09-28 MED ORDER — METOPROLOL TARTRATE 5 MG/5ML IV SOLN: 5.0000 mg | Freq: Once | INTRAVENOUS | Status: DC

## 2018-09-28 MED ORDER — ENSURE ENLIVE PO LIQD: 237.0000 mL | Freq: Two times a day (BID) | ORAL | Status: DC

## 2018-09-28 MED ORDER — VANCOMYCIN HCL 500 MG IV SOLR
500.0000 mg | Freq: Two times a day (BID) | INTRAVENOUS | Status: DC
  Administered 2018-09-29: 500 mg via INTRAVENOUS
  Filled 2018-09-28 (×2): qty 500

## 2018-09-28 MED ORDER — ONDANSETRON HCL 4 MG/2ML IJ SOLN: 4.0000 mg | Freq: Four times a day (QID) | INTRAMUSCULAR | Status: DC | PRN

## 2018-09-28 MED ORDER — LORAZEPAM 2 MG/ML IJ SOLN
0.7500 mg | Freq: Once | INTRAMUSCULAR | Status: AC
  Administered 2018-09-28: 0.75 mg via INTRAVENOUS
  Filled 2018-09-28: qty 1

## 2018-09-28 MED ORDER — POTASSIUM CHLORIDE CRYS ER 20 MEQ PO TBCR: 20.0000 meq | EXTENDED_RELEASE_TABLET | Freq: Once | ORAL | Status: DC

## 2018-09-28 MED ORDER — SODIUM CHLORIDE 0.9 % IV SOLN
2.0000 g | Freq: Once | INTRAVENOUS | Status: AC
  Administered 2018-09-28: 2 g via INTRAVENOUS
  Filled 2018-09-28: qty 2

## 2018-09-28 MED ORDER — MAGNESIUM SULFATE 2 GM/50ML IV SOLN
2.0000 g | Freq: Once | INTRAVENOUS | Status: AC
  Administered 2018-09-28: 2 g via INTRAVENOUS
  Filled 2018-09-28: qty 50

## 2018-09-28 MED ORDER — LORAZEPAM 2 MG/ML IJ SOLN
1.0000 mg | INTRAMUSCULAR | Status: DC | PRN
  Administered 2018-09-28 – 2018-09-30 (×5): 1 mg via INTRAVENOUS
  Filled 2018-09-28 (×6): qty 1

## 2018-09-28 MED ORDER — HALOPERIDOL LACTATE 5 MG/ML IJ SOLN
2.0000 mg | Freq: Once | INTRAMUSCULAR | Status: AC
  Administered 2018-09-28: 2 mg via INTRAVENOUS
  Filled 2018-09-28: qty 1

## 2018-09-28 MED ORDER — LORAZEPAM 2 MG/ML IJ SOLN
1.0000 mg | Freq: Once | INTRAMUSCULAR | Status: AC
  Administered 2018-09-28: 1 mg via INTRAVENOUS
  Filled 2018-09-28: qty 1

## 2018-09-28 MED ORDER — SODIUM CHLORIDE 0.9 % IV BOLUS (SEPSIS)
250.0000 mL | Freq: Once | INTRAVENOUS | Status: AC
  Administered 2018-09-28: 250 mL via INTRAVENOUS

## 2018-09-28 MED ORDER — FLUCONAZOLE 100MG IVPB
100.0000 mg | INTRAVENOUS | Status: DC
  Administered 2018-09-28: 100 mg via INTRAVENOUS
  Filled 2018-09-28 (×2): qty 50

## 2018-09-28 MED ORDER — PROCHLORPERAZINE EDISYLATE 10 MG/2ML IJ SOLN: 5.0000 mg | Freq: Four times a day (QID) | INTRAMUSCULAR | Status: DC | PRN

## 2018-09-28 MED ORDER — ONDANSETRON HCL 4 MG PO TABS: 4.0000 mg | ORAL_TABLET | Freq: Four times a day (QID) | ORAL | Status: DC | PRN

## 2018-09-28 MED ORDER — CLOTRIMAZOLE 1 % EX CREA
TOPICAL_CREAM | Freq: Two times a day (BID) | CUTANEOUS | Status: DC
  Administered 2018-09-29 – 2018-10-01 (×5): via TOPICAL
  Filled 2018-09-28 (×2): qty 15

## 2018-09-28 NOTE — ED Provider Notes (Signed)
[facility] EMERGENCY DEPARTMENT Provider Note   CSN: [id number] Arrival date & time: 09/28/18  0754     History   Chief Complaint Chief Complaint  Patient presents with  . Atrial Fibrillation    HPI [facility] is a 84 y.o. male.  HPI  Has pain everywhere - has hx o HTN, and PE and RCC - unsure if he has hx of afib - he has dementia.  Call eveidently went out for SOB _ was in afib at 180's - given Lopressor 5mg  prehospital according to EMS report.  LEVEL 5 CAVEAT applies  No other c/o.    Past Medical History:  Diagnosis Date  . Anxiety   . Arthritis   . Biliary acute pancreatitis   . Bladder cancer ([place])   . Carpal tunnel syndrome   . Choledocholithiasis   . COPD (chronic obstructive pulmonary disease) ([place])   . Dementia ([place]) 03/13/2018  . Diverticulosis   . Esophageal stricture   . GERD (gastroesophageal reflux disease)   . Hiatal hernia   . Hypertension   . Mood disorder ([place]) 06/08/2016  . Osteoarthritis   . PE (pulmonary embolism)   . Pneumonia   . Pulmonary embolus ([place])   . Renal cancer ([place])   . Shortness of breath   . Urinary frequency   . Vitamin B12 deficiency     Patient Active Problem List   Diagnosis Date Noted  . HCAP (healthcare-associated pneumonia) 09/28/2018  . Scaling of skin 04/16/2018  . Dementia ([place]) 03/13/2018  . Renal mass 03/13/2018  . Urinary retention 03/13/2018  . Chronic diarrhea 10/18/2016  . Aortic stenosis 06/08/2016  . Mood disorder ([place]) 06/08/2016  . Advance directive discussed with patient 06/08/2016  . Routine general medical examination at a health care facility 05/13/2014  . Atrial tachycardia ([place]) 06/27/2012  . GERD (gastroesophageal reflux disease) 09/23/2011  . Osteoarthritis, multiple sites 09/23/2011  . COPD (chronic obstructive pulmonary disease) ([place]) 04/27/2011  . Essential hypertension, benign 10/07/2009    Past Surgical History:  Procedure Laterality Date  . CARPAL  TUNNEL RELEASE  12/11   Right---Dr Burney Gauze  . CHOLECYSTECTOMY    . CYSTOSTOMY W/ BLADDER BIOPSY     multiple  . ESOPHAGOGASTRODUODENOSCOPY    . [place]   right  . PARTIAL NEPHRECTOMY    . TRANSURETHRAL RESECTION OF PROSTATE     limited        Home Medications    Prior to Admission medications   Medication Sig Start Date End Date Taking? Authorizing Provider  aspirin EC 81 MG tablet Take 81 mg by mouth daily.   Yes [provider]  cephALEXin (KEFLEX) 250 MG/5ML suspension Take 500 mg by mouth 2 (two) times daily.    [provider]  cephALEXin (KEFLEX) 500 MG capsule Take 1 capsule (500 mg total) by mouth 3 (three) times daily. Patient not taking: Reported on 09/28/2018 07/13/18   Jola Schmidt, MD  memantine (NAMENDA) 5 MG tablet Take 1 tablet (5 mg total) by mouth 2 (two) times daily. Patient not taking: Reported on 07/13/2018 03/13/18   Viviana Simpler I, MD  urea (CARMOL) 40 % CREA Apply 1 application topically 2 (two) times daily. Patient not taking: Reported on 07/13/2018 04/16/18   Venia Carbon, MD    Family History Family History  Problem Relation Age of Onset  . Hypertension Mother   . Heart failure Mother   . Stroke Mother   . Lymphoma  Son   . Stroke Brother   . Heart attack Brother   . Diabetes Sister   . Stroke Sister   . Colon cancer Neg Hx     Social History Social History   Tobacco Use  . Smoking status: Former Smoker    Packs/day: 1.00    Years: 40.00    Pack years: 40.00    Types: Cigarettes    Last attempt to quit: 06/25/1981    Years since quitting: 37.2  . Smokeless tobacco: Never Used  Substance Use Topics  . Alcohol use: No  . Drug use: No     Allergies   Acetaminophen   Review of Systems Review of Systems  Unable to perform ROS: Dementia     Physical Exam Updated Vital Signs BP (!) 142/90   Pulse (!) 108   Temp (!) 97.3 F (36.3 C) (Oral)   Resp (!) 26   Ht 1.854 m (6\' 1" )   Wt  68.9 kg   SpO2 92%   BMI 20.05 kg/m   Physical Exam  Constitutional: He appears well-developed and well-nourished. No distress.  HENT:  Head: Normocephalic and atraumatic.  Mouth/Throat: Oropharynx is clear and moist. No oropharyngeal exudate.  Eyes: Pupils are equal, round, and reactive to light. Conjunctivae and EOM are normal. Right eye exhibits no discharge. Left eye exhibits no discharge. No scleral icterus.  Neck: Normal range of motion. Neck supple. No JVD present. No thyromegaly present.  Cardiovascular: Normal heart sounds and intact distal pulses. Exam reveals no gallop and no friction rub.  No murmur heard. afib in the low 100's.    Pulmonary/Chest: Effort normal. No respiratory distress. He has no wheezes. He has rales ( There is no respiratory distress but the patient has rales bilaterally at the bases).  Abdominal: Soft. Bowel sounds are normal. He exhibits no distension and no mass. There is no tenderness.  Musculoskeletal: Normal range of motion. He exhibits edema ( Scant bilateral edema). He exhibits no tenderness.  Lymphadenopathy:    He has no cervical adenopathy.  Neurological: He is alert. Coordination normal.  Skin: Skin is warm and dry. No rash noted. No erythema.  desquamation of the bilateral lower extremities below the ankles  Psychiatric: He has a normal mood and affect. His behavior is normal.  Nursing note and vitals reviewed.    ED Treatments / Results  Labs (all labs ordered are listed, but only abnormal results are displayed) Labs Reviewed  CBC WITH DIFFERENTIAL/PLATELET - Abnormal; Notable for the following components:      Result Value   WBC 12.7 (*)    Platelets 144 (*)    Neutro Abs 11.1 (*)    All other components within normal limits  PROTIME-INR - Abnormal; Notable for the following components:   Prothrombin Time 15.6 (*)    All other components within normal limits  BRAIN NATRIURETIC PEPTIDE - Abnormal; Notable for the following  components:   B Natriuretic Peptide 667.9 (*)    All other components within normal limits  I-STAT CG4 LACTIC ACID, ED - Abnormal; Notable for the following components:   Lactic Acid, Venous 2.41 (*)    All other components within normal limits  I-STAT CHEM 8, ED - Abnormal; Notable for the following components:   Glucose, Bld 172 (*)    Calcium, Ion 1.11 (*)    All other components within normal limits  CULTURE, BLOOD (ROUTINE X 2)  CULTURE, BLOOD (ROUTINE X 2)  URINE CULTURE  EXPECTORATED SPUTUM  ASSESSMENT W REFEX TO RESP CULTURE  GRAM STAIN  TSH  TROPONIN I  BASIC METABOLIC PANEL  URINALYSIS, ROUTINE W REFLEX MICROSCOPIC  MAGNESIUM  PHOSPHORUS  STREP PNEUMONIAE URINARY ANTIGEN  I-STAT CG4 LACTIC ACID, ED    EKG EKG Interpretation  Date/Time:  Friday September 28 2018 08:10:18 EDT Ventricular Rate:  94 PR Interval:    QRS Duration: 105 QT Interval:  387 QTC Calculation: 484 R Axis:   -19 Text Interpretation:  Atrial fibrillation Borderline left axis deviation Abnormal R-wave progression, early transition Borderline prolonged QT interval Confirmed by Noemi Chapel [phone]) on 09/28/2018 8:22:27 AM   Radiology Dg Chest Port 1 View  Result Date: 09/28/2018 CLINICAL DATA:  New onset atrial fibrillation. EXAM: PORTABLE CHEST 1 VIEW COMPARISON:  Chest x-ray dated July 26, 2012. FINDINGS: New cardiomegaly with pulmonary vascular congestion and mild interstitial edema. Atherosclerotic calcification of the aortic arch. Small left greater than right pleural effusions with left lower lobe consolidation/atelectasis. Mild right basilar subsegmental atelectasis. No pneumothorax. No acute osseous abnormality. IMPRESSION: 1. New cardiomegaly and mild interstitial edema with small bilateral pleural effusions. 2. Left lower lobe consolidation versus atelectasis. Electronically Signed   By: Titus Dubin M.D.   On: 09/28/2018 08:53    Procedures .Critical Care Performed by: Noemi Chapel, MD Authorized by: Noemi Chapel, MD   Critical care provider statement:    Critical care time (minutes):  35   Critical care time was exclusive of:  Separately billable procedures and treating other patients and teaching time   Critical care was time spent personally by me on the following activities:  Blood draw for specimens, development of treatment plan with patient or surrogate, discussions with consultants, evaluation of patient's response to treatment, examination of patient, obtaining history from patient or surrogate, ordering and performing treatments and interventions, ordering and review of laboratory studies, ordering and review of radiographic studies, pulse oximetry, re-evaluation of patient's condition and review of old charts   (including critical care time)  Medications Ordered in ED Medications  vancomycin (VANCOCIN) IVPB 1000 mg/200 mL premix (has no administration in time range)  ceFEPIme (MAXIPIME) 2 g in sodium chloride 0.9 % 100 mL IVPB (has no administration in time range)  heparin injection 5,000 Units (has no administration in time range)  ondansetron (ZOFRAN) tablet 4 mg (has no administration in time range)    Or  ondansetron (ZOFRAN) injection 4 mg (has no administration in time range)  insulin aspart (novoLOG) injection 0-9 Units (has no administration in time range)  LORazepam (ATIVAN) injection 1 mg (1 mg Intravenous Given 09/28/18 0945)     Initial Impression / Assessment and Plan / ED Course  I have reviewed the triage vital signs and the nursing notes.  Pertinent labs & imaging results that were available during my care of the patient were reviewed by me and considered in my medical decision making (see chart for details).     New onset Afib - seems rate improved after meds. He has abnormal lung exam with rales, and his oxygen is approximately 93 to 95% on room air, will avoid supplemental unless he desaturates.  The exact etiology of his  atrial fibrillation is unclear, and while he does have a history of pulmonary embolism he does not have any chest pain at this time.  He is complaining of pain all over but is not more specific.  The call went out for shortness of breath and with his rales he will need an evaluation for  congestive heart failure, cardiac disease, pneumonia.  X-ray reveals pneumonia, this is consistent with his leukocytosis, elevated lactic acid (less than 4), thankfully he is not hypotensive but has a mild persistent tachycardia.  He has also had a short run of ventricular tachycardia, this amounted to 9 beats, this was caught on cardiac monitoring, he will need continual cardiac monitoring, discussed with the hospitalist will admit.  The patient is critically ill.  Final Clinical Impressions(s) / ED Diagnoses   Final diagnoses:  Sepsis without acute organ dysfunction, due to unspecified organism [facility])  Community acquired pneumonia, unspecified laterality    ED Discharge Orders    None       Noemi Chapel, MD 09/28/18 1016

## 2018-09-28 NOTE — ED Notes (Signed)
CBG 97 

## 2018-09-28 NOTE — Progress Notes (Signed)
Pharmacy Antibiotic Note  [facility] is a 84 y.o. male admitted on 09/28/2018 with pneumonia. Pharmacy has been consulted for vancomycin and cefepime dosing. Pt is afebrile and WBC is mildly elevated at 12.7. Lactic acid is elevated at 2.41.   Plan: Vancomycin 1gm IV x 1 then 500mg  IV Q12H Cefepime 2gm IV x 1 then 1gm IV Q8H F/u renal fxn, C&S, clinical status and trough at SS  Height: 6\' 1"  (185.4 cm) Weight: 152 lb (68.9 kg) IBW/kg (Calculated) : 79.9  Temp (24hrs), Avg:97.3 F (36.3 C), Min:97.3 F (36.3 C), Max:97.3 F (36.3 C)  Recent Labs  Lab 09/28/18 0804 09/28/18 0931  WBC 12.7*  --   CREATININE  --  0.90  LATICACIDVEN  --  2.41*    Estimated Creatinine Clearance: 53.2 mL/min (by C-G formula based on SCr of 0.9 mg/dL).    Allergies  Allergen Reactions  . Acetaminophen Other (See Comments)    "made him feel silly"    Antimicrobials this admission: Vanc 10/25>> Cefepime 10/25>>  Dose adjustments this admission: N/A  Microbiology results: Pending  Thank you for allowing pharmacy to be a part of this patient's care.  Denene Alamillo, Rande Lawman 09/28/2018 10:05 AM

## 2018-09-28 NOTE — ED Notes (Signed)
[facility] [place]-Resident and [place]-RN notified of elevated CG-4

## 2018-09-28 NOTE — ED Notes (Signed)
ECHO at bedside.

## 2018-09-28 NOTE — H&P (Signed)
History and Physical    Sami Froh CXK:[id number] DOB: August 14, 1925 DOA: 09/28/2018  PCP: Venia Carbon, MD   Patient coming from: Home.  I have personally briefly reviewed patient's old medical records in [place]  Chief Complaint: Shortness of breath.  HPI: Bryan David is a 84 y.o. male with medical history significant of anxiety, osteoarthritis, history of gallstone pancreatitis, choledocholithiasis, bladder cancer, carpal tunnel syndrome, COPD, dementia, diverticulosis, GERD, esophageal stricture, hiatal hernia, hypertension, mood disorder, history of pulmonary embolism, history of pneumonia, renal cancer, vitamin B12 deficiency who is brought to the emergency department due to dyspnea and pleuritic chest pain.  He was found to be on A. fib with RVR when EMS arrived with a heart rate of 189 bpm.  He was given 5 mg of metoprolol prior to arrival to the emergency department.  His heart rhythm was still irregular and his heart rate decreased to the 90s to 110s.  He does not take any medications at home and will refuse to take them, as per his wife and daughter.  They state that his mental status has been declining over the past few months.  ED Course: Initial vital signs temperature 97.3 F, pulse 82, respirations 22, O2 sat 100% on room air and blood pressure 134/94 mmHg.  He received 2250 mL NS bolus  cefepime 2 g and at thousand milligrams of vancomycin IVPB.  He has received lorazepam at least twice for a total of 1.75 mg and Haldol 2 mg IVP due to restlessness.  Urinalysis shows large hemoglobinuria, mild proteinuria, large leukocyte esterase, more than 50 RBCs and more than 50 WBC with a few bacteria microscopic (the patient has a chronic indwelling catheter).  White count was 12.7, hemoglobin 14.0 and platelets 144.  Lactic acid was initially elevated, but has normalized since then.  Troponin 0 0.05 and then 0.07 ng/mL.  Electrolytes were normal.  His renal function is  normal.  Glucose is 215 and calcium 8.8 mg/dL.  His chest radiograph showed new cardiomegaly with mild interstitial edema with small bilateral pleural effusions.  There is a left lower lobe consolidation versus atelectasis.  Review of Systems: Unable to obtain.  Past Medical History:  Diagnosis Date  . Anxiety   . Arthritis   . Biliary acute pancreatitis   . Bladder cancer ([place])   . Carpal tunnel syndrome   . Choledocholithiasis   . COPD (chronic obstructive pulmonary disease) ([place])   . Dementia ([place]) 03/13/2018  . Diverticulosis   . Esophageal stricture   . GERD (gastroesophageal reflux disease)   . Hiatal hernia   . Hypertension   . Mood disorder ([place]) 06/08/2016  . Osteoarthritis   . PE (pulmonary embolism)   . Pneumonia   . Pulmonary embolus ([place])   . Renal cancer ([place])   . Shortness of breath   . Urinary frequency   . Vitamin B12 deficiency     Past Surgical History:  Procedure Laterality Date  . CARPAL TUNNEL RELEASE  12/11   Right---Dr Burney Gauze  . CHOLECYSTECTOMY    . CYSTOSTOMY W/ BLADDER BIOPSY     multiple  . ESOPHAGOGASTRODUODENOSCOPY    . [place]   right  . PARTIAL NEPHRECTOMY    . TRANSURETHRAL RESECTION OF PROSTATE     limited     reports that he quit smoking about 37 years ago. His smoking use included cigarettes. He has a 40.00 pack-year smoking history. He has never used smokeless tobacco.  He reports that he does not drink alcohol or use drugs.  Allergies  Allergen Reactions  . Acetaminophen Other (See Comments)    "made him feel silly"    Family History  Problem Relation Age of Onset  . Hypertension Mother   . Heart failure Mother   . Stroke Mother   . Lymphoma Son   . Stroke Brother   . Heart attack Brother   . Diabetes Sister   . Stroke Sister   . Colon cancer Neg Hx    Prior to Admission medications   Medication Sig Start Date End Date Taking? Authorizing Provider  aspirin EC 81 MG tablet Take 81 mg by mouth  daily.   Yes [provider]  cephALEXin (KEFLEX) 250 MG/5ML suspension Take 500 mg by mouth 2 (two) times daily.    [provider]  cephALEXin (KEFLEX) 500 MG capsule Take 1 capsule (500 mg total) by mouth 3 (three) times daily. Patient not taking: Reported on 09/28/2018 07/13/18   Jola Schmidt, MD  memantine (NAMENDA) 5 MG tablet Take 1 tablet (5 mg total) by mouth 2 (two) times daily. Patient not taking: Reported on 07/13/2018 03/13/18   Viviana Simpler I, MD  urea (CARMOL) 40 % CREA Apply 1 application topically 2 (two) times daily. Patient not taking: Reported on 07/13/2018 04/16/18   Venia Carbon, MD    Physical Exam: Vitals:   09/28/18 0830 09/28/18 0845 09/28/18 0915 09/28/18 1000  BP: 126/79 (!) 154/94 (!) 142/90   Pulse: (!) 57 93 (!) 108   Resp: (!) 22 (!) 30 (!) 26   Temp:      TempSrc:      SpO2: 94% 93% 92%   Weight:    68.9 kg  Height:    6\' 1"  (1.854 m)    Constitutional: Sedated, but in NAD. Eyes: PERRL, lids and conjunctivae injected. ENMT: Mucous membranes are mildly dry. Posterior pharynx clear of any exudate or lesions. Neck: Normal, supple, no masses, no thyromegaly Respiratory: Decreased breath sounds on bases, but otherwise clear to auscultation bilaterally, no wheezing, no crackles. Normal respiratory effort. No accessory muscle use.  Cardiovascular: Irregularly irregular, tachycardic at 108 bpm, no murmurs / rubs / gallops. No extremity edema. 2+ pedal pulses. No carotid bruits.  Abdomen: Soft, no tenderness, no masses palpated. No hepatosplenomegaly. Bowel sounds positive.   Genitourinary: Foley catheter present. Musculoskeletal: no clubbing / cyanosis. Good ROM, no contractures. Normal muscle tone.  Skin: Severe hyperkeratosis and desquamation of the soles, less severe on dorsal aspect of the feet and both pretibial areas.  Pretibial areas looked mildly erythematous.  There is onychomycosis of toenails. Neurologic:  Sedated.  Unable to  evaluate. Psychiatric: The patient is currently sedated.  Unable to evaluate.   Labs on Admission: I have personally reviewed following labs and imaging studies  CBC: Recent Labs  Lab 09/28/18 0804 09/28/18 0931  WBC 12.7*  --   NEUTROABS 11.1*  --   HGB 14.0 15.6  HCT 45.8 46.0  MCV 88.8  --   PLT 144*  --    Basic Metabolic Panel: Recent Labs  Lab 09/28/18 0931  NA 139  K 3.6  CL 99  GLUCOSE 172*  BUN 14  CREATININE 0.90   GFR: Estimated Creatinine Clearance: 53.2 mL/min (by C-G formula based on SCr of 0.9 mg/dL). Liver Function Tests: No results for input(s): AST, ALT, ALKPHOS, BILITOT, PROT, ALBUMIN in the last 168 hours. No results for input(s): LIPASE, AMYLASE in the  last 168 hours. No results for input(s): AMMONIA in the last 168 hours. Coagulation Profile: Recent Labs  Lab 09/28/18 0804  INR 1.25   Cardiac Enzymes: No results for input(s): CKTOTAL, CKMB, CKMBINDEX, TROPONINI in the last 168 hours. BNP (last 3 results) No results for input(s): PROBNP in the last 8760 hours. HbA1C: No results for input(s): HGBA1C in the last 72 hours. CBG: No results for input(s): GLUCAP in the last 168 hours. Lipid Profile: No results for input(s): CHOL, HDL, LDLCALC, TRIG, CHOLHDL, LDLDIRECT in the last 72 hours. Thyroid Function Tests: No results for input(s): TSH, T4TOTAL, FREET4, T3FREE, THYROIDAB in the last 72 hours. Anemia Panel: No results for input(s): VITAMINB12, FOLATE, FERRITIN, TIBC, IRON, RETICCTPCT in the last 72 hours. Urine analysis:    Component Value Date/Time   COLORURINE YELLOW 07/13/2018 0408   APPEARANCEUR TURBID (A) 07/13/2018 0408   LABSPEC 1.013 07/13/2018 0408   PHURINE 8.0 07/13/2018 0408   GLUCOSEU NEGATIVE 07/13/2018 0408   HGBUR MODERATE (A) 07/13/2018 0408   BILIRUBINUR NEGATIVE 07/13/2018 0408   KETONESUR NEGATIVE 07/13/2018 0408   PROTEINUR 100 (A) 07/13/2018 0408   UROBILINOGEN 4.0 (H) 06/26/2012 1029   NITRITE POSITIVE (A)  07/13/2018 0408   LEUKOCYTESUR LARGE (A) 07/13/2018 0408    Radiological Exams on Admission: Dg Chest Port 1 View  Result Date: 09/28/2018 CLINICAL DATA:  New onset atrial fibrillation. EXAM: PORTABLE CHEST 1 VIEW COMPARISON:  Chest x-ray dated July 26, 2012. FINDINGS: New cardiomegaly with pulmonary vascular congestion and mild interstitial edema. Atherosclerotic calcification of the aortic arch. Small left greater than right pleural effusions with left lower lobe consolidation/atelectasis. Mild right basilar subsegmental atelectasis. No pneumothorax. No acute osseous abnormality. IMPRESSION: 1. New cardiomegaly and mild interstitial edema with small bilateral pleural effusions. 2. Left lower lobe consolidation versus atelectasis. Electronically Signed   By: Titus Dubin M.D.   On: 09/28/2018 08:53    EKG: Independently reviewed. Vent. rate 94 BPM PR interval * ms QRS duration 105 ms QT/QTc 387/484 ms P-R-T axes * -19 57 Atrial fibrillation Borderline left axis deviation Abnormal R-wave progression, early transition Borderline prolonged QT interval  Assessment/Plan Principal Problem:   HCAP (healthcare-associated pneumonia) Admit to telemetry/inpatient. Continue supplemental oxygen. Bronchodilators as needed. Cefepime per pharmacy. Vancomycin per pharmacy. Check strep pneumoniae urinary antigen. Check sputum Gram stain, culture and sensitivity. Follow-up blood cultures.  Active Problems:   Paroxysmal atrial fibrillation (HCC) CHA?DS?-VASc Score of at least 3 pending echo and hemoglobin A1c level. Not on anticoagulation or rate control medications. Monitor electrolytes and at least, treat while in the hospital.    Lactic acidosis Received IV fluids. Follow-up lactic acid level.    UTI (urinary tract infection) Continue cefepime. Follow-up blood cultures and sensitivity. Follow-up urine culture and sensitivity.    Essential hypertension, benign Not on  antihypertensives at home. Monitor blood pressure. Consider PRN antihypertensive if blood pressure trends up.    COPD (chronic obstructive pulmonary disease) (HCC) Signs of decompensation at this time. Supplemental oxygen as needed. Bronchodilators as needed.    GERD (gastroesophageal reflux disease) Famotidine 20 mg IVPB every 12 hours.    Tinea pedis of both feet   Onychomycosis of all toenails Per patient's daughter, he has had multiple wounds in feet and legs. Start Diflucan 100 mg IVP daily. Clotrimazole topically to affected area twice a day.    Dementia ([place]) Supportive care.    Hyperglycemia Carbohydrate modified diet. Check hemoglobin A1c. CBG monitoring with regular insulin sliding scale while in the  hospital.     DVT prophylaxis: Heparin SQ. Code Status: Full code. Family Communication: His wife and daughter were present in the ED room. Disposition Plan: Admit for HCAP treatment x2 to 3 days and atrial fibrillation work-up. Consults called:  Admission status: Inpatient/telemetry.   Reubin Milan MD [facility] Pager [phone]  If 7PM-7AM, please contact night-coverage [email or web address] Password [email or web address]  09/28/2018, 10:19 AM

## 2018-09-28 NOTE — ED Notes (Signed)
ED Provider at bedside. 

## 2018-09-28 NOTE — Progress Notes (Signed)
  Echocardiogram 2D Echocardiogram has been performed.  Bryan David 09/28/2018, 3:33 PM

## 2018-09-28 NOTE — ED Triage Notes (Signed)
Pt from home via ems; called out for sob, cp worse than normal; found to be in afib rvr, rate 189; 5 mg metoprolol pta; pt hard of hearing  96 - 115 irregular 157/92 93-95% RA RR 24

## 2018-09-28 NOTE — ED Notes (Signed)
Pt complaining of urinary catheter pain.

## 2018-09-28 NOTE — ED Notes (Signed)
Pt found to have pulled out both IVs and pulled all monitors off. Admitting MD paged and green mittens ordered

## 2018-09-29 ENCOUNTER — Inpatient Hospital Stay (HOSPITAL_COMMUNITY): Payer: Medicare Other

## 2018-09-29 DIAGNOSIS — E43 Unspecified severe protein-calorie malnutrition: Secondary | ICD-10-CM

## 2018-09-29 DIAGNOSIS — J189 Pneumonia, unspecified organism: Secondary | ICD-10-CM

## 2018-09-29 LAB — GLUCOSE, CAPILLARY
Glucose-Capillary: 99 mg/dL (ref 70–99)
Glucose-Capillary: 112 mg/dL — ABNORMAL HIGH (ref 70–99)
Glucose-Capillary: 105 mg/dL — ABNORMAL HIGH (ref 70–99)

## 2018-09-29 LAB — CBC WITH DIFFERENTIAL/PLATELET
ABS IMMATURE GRANULOCYTES: 0.03 10*3/uL (ref 0.00–0.07)
BASOS ABS: 0 10*3/uL (ref 0.0–0.1)
Basophils Relative: 1 %
EOS PCT: 1 %
Eosinophils Absolute: 0 10*3/uL (ref 0.0–0.5)
HEMATOCRIT: 41.8 % (ref 39.0–52.0)
HEMOGLOBIN: 12.7 g/dL — AB (ref 13.0–17.0)
IMMATURE GRANULOCYTES: 0 %
LYMPHS ABS: 0.8 10*3/uL (ref 0.7–4.0)
LYMPHS PCT: 10 %
MCH: 26.5 pg (ref 26.0–34.0)
MCHC: 30.4 g/dL (ref 30.0–36.0)
MCV: 87.1 fL (ref 80.0–100.0)
Monocytes Absolute: 0.8 10*3/uL (ref 0.1–1.0)
Monocytes Relative: 9 %
NEUTROS PCT: 79 %
NRBC: 0 % (ref 0.0–0.2)
Neutro Abs: 6.7 10*3/uL (ref 1.7–7.7)
Platelets: 104 10*3/uL — ABNORMAL LOW (ref 150–400)
RBC: 4.8 MIL/uL (ref 4.22–5.81)
RDW: 14.6 % (ref 11.5–15.5)
WBC: 8.4 10*3/uL (ref 4.0–10.5)

## 2018-09-29 LAB — COMPREHENSIVE METABOLIC PANEL
ALBUMIN: 3.1 g/dL — AB (ref 3.5–5.0)
ALK PHOS: 65 U/L (ref 38–126)
ALT: 10 U/L (ref 0–44)
AST: 17 U/L (ref 15–41)
Anion gap: 6 (ref 5–15)
BILIRUBIN TOTAL: 1.4 mg/dL — AB (ref 0.3–1.2)
BUN: 11 mg/dL (ref 8–23)
CALCIUM: 8.8 mg/dL — AB (ref 8.9–10.3)
CO2: 24 mmol/L (ref 22–32)
Chloride: 109 mmol/L (ref 98–111)
Creatinine, Ser: 1 mg/dL (ref 0.61–1.24)
GFR calc Af Amer: >60 mL/min (ref >60)
GFR calc non Af Amer: >60 mL/min (ref >60)
GLUCOSE: 104 mg/dL — AB (ref 70–99)
Potassium: 3.9 mmol/L (ref 3.5–5.1)
Sodium: 139 mmol/L (ref 135–145)
Total Protein: 6 g/dL — ABNORMAL LOW (ref 6.5–8.1)

## 2018-09-29 LAB — TSH: TSH: 2.356 u[IU]/mL (ref 0.350–4.500)

## 2018-09-29 MED ORDER — SODIUM CHLORIDE 0.9 % IV SOLN
500.0000 mg | INTRAVENOUS | Status: DC
  Administered 2018-09-30: 500 mg via INTRAVENOUS
  Filled 2018-09-29: qty 500

## 2018-09-29 MED ORDER — ADULT MULTIVITAMIN LIQUID CH
15.0000 mL | Freq: Every day | ORAL | Status: DC
  Filled 2018-09-29 (×2): qty 15

## 2018-09-29 MED ORDER — AZITHROMYCIN 250 MG PO TABS
500.0000 mg | ORAL_TABLET | Freq: Every day | ORAL | Status: DC
  Filled 2018-09-29: qty 2

## 2018-09-29 MED ORDER — KETOROLAC TROMETHAMINE 30 MG/ML IJ SOLN
15.0000 mg | Freq: Once | INTRAMUSCULAR | Status: AC
  Administered 2018-09-29: 15 mg via INTRAVENOUS
  Filled 2018-09-29: qty 1

## 2018-09-29 MED ORDER — QUETIAPINE FUMARATE 25 MG PO TABS
25.0000 mg | ORAL_TABLET | Freq: Every day | ORAL | Status: DC
  Administered 2018-09-29: 25 mg via ORAL
  Filled 2018-09-29: qty 1

## 2018-09-29 MED ORDER — METOPROLOL TARTRATE 5 MG/5ML IV SOLN
5.0000 mg | INTRAVENOUS | Status: DC | PRN
  Administered 2018-09-29 – 2018-09-30 (×3): 5 mg via INTRAVENOUS
  Filled 2018-09-29 (×3): qty 5

## 2018-09-29 MED ORDER — ACETAMINOPHEN 650 MG RE SUPP: 650.0000 mg | RECTAL | Status: DC | PRN

## 2018-09-29 MED ORDER — FLUCONAZOLE 100 MG PO TABS
100.0000 mg | ORAL_TABLET | Freq: Every day | ORAL | Status: DC
  Filled 2018-09-29: qty 1

## 2018-09-29 MED ORDER — SODIUM CHLORIDE 0.9 % IV SOLN
1.0000 g | INTRAVENOUS | Status: DC
  Administered 2018-09-29 – 2018-09-30 (×2): 1 g via INTRAVENOUS
  Filled 2018-09-29 (×2): qty 10

## 2018-09-29 MED ORDER — HALOPERIDOL LACTATE 5 MG/ML IJ SOLN
5.0000 mg | Freq: Once | INTRAMUSCULAR | Status: AC
  Administered 2018-09-29: 5 mg via INTRAVENOUS
  Filled 2018-09-29: qty 1

## 2018-09-29 MED ORDER — ASPIRIN 81 MG PO CHEW: 81.0000 mg | CHEWABLE_TABLET | Freq: Every day | ORAL | Status: DC

## 2018-09-29 MED ORDER — ONDANSETRON HCL 4 MG/2ML IJ SOLN: 4.0000 mg | Freq: Four times a day (QID) | INTRAMUSCULAR | Status: DC | PRN

## 2018-09-29 MED ORDER — ACETAMINOPHEN 325 MG PO TABS: 650.0000 mg | ORAL_TABLET | Freq: Four times a day (QID) | ORAL | Status: DC | PRN

## 2018-09-29 MED ORDER — BOOST / RESOURCE BREEZE PO LIQD CUSTOM
1.0000 | Freq: Three times a day (TID) | ORAL | Status: DC
  Administered 2018-10-01: 1 via ORAL

## 2018-09-29 MED ORDER — HALOPERIDOL LACTATE 5 MG/ML IJ SOLN
2.0000 mg | Freq: Four times a day (QID) | INTRAMUSCULAR | Status: DC | PRN
  Administered 2018-09-29 – 2018-09-30 (×2): 2 mg via INTRAVENOUS
  Filled 2018-09-29 (×2): qty 1

## 2018-09-29 MED ORDER — METOPROLOL TARTRATE 25 MG PO TABS
25.0000 mg | ORAL_TABLET | Freq: Three times a day (TID) | ORAL | Status: DC
  Filled 2018-09-29: qty 1

## 2018-09-29 MED ORDER — DILTIAZEM HCL-DEXTROSE 100-5 MG/100ML-% IV SOLN (PREMIX)
5.0000 mg/h | INTRAVENOUS | Status: DC
  Administered 2018-09-29: 5 mg/h via INTRAVENOUS
  Filled 2018-09-29: qty 100

## 2018-09-29 MED ORDER — METOPROLOL TARTRATE 5 MG/5ML IV SOLN
5.0000 mg | Freq: Four times a day (QID) | INTRAVENOUS | Status: DC
  Administered 2018-09-29 – 2018-09-30 (×3): 5 mg via INTRAVENOUS
  Filled 2018-09-29 (×3): qty 5

## 2018-09-29 NOTE — Progress Notes (Signed)
   Case discussed with Dr. Posey Pronto by telephone. Echo and chart reviewed personally.   84 y/o with chronic severe dementia/mood disorder, bladder CA with indwelling Foley, renal CA but no known cardiac issues.  Admitted last night with SOB. Found to have newly diagnosed AF with RVR in setting of recurrent UTI, Echo obtained and EF 20-25%.   Now rate controlled on lopressor. BP stable. Troponin 0.08. ECG without ischemics changes.   He is not able to swallow currently and unable to take pills. Head CT pending for possible CVA  Agree that this may be tachy-induced CM vs septic CM. Given patients underlying medical conditions not candidate for aggressive w/u or cardioversion.   Would continue rate control strategy and anti-coagulate with Eliquis if possible. If unable to rate control with lopressor can use amiodarone.  Agree with Palliative Care Consult. Please reconsult as needed.   Glori Bickers, MD  12:47 PM

## 2018-09-29 NOTE — Progress Notes (Signed)
SLP Cancellation Note  Patient Details Name: Bryan David MRN: [id number] DOB: [birth year]   Cancelled treatment:       Reason Eval/Treat Not Completed: Fatigue/lethargy limiting ability to participate . Pt lethargic after ativan; will follow up next date.  Deneise Lever, [place], CCC-SLP Speech-Language Pathologist Acute Rehabilitation Services Pager: [phone] Office: [phone]   Aliene Altes 09/29/2018, 3:13 PM

## 2018-09-29 NOTE — Progress Notes (Addendum)
[facility] Progress Note  Patient: Bryan David AJO:[id number]   PCP: Viviana Simpler I, MD DOB: 08/21/1925   DOA: 09/28/2018   DOS: 09/29/2018   Date of Service: the patient was seen and examined on 09/29/2018  Brief hospital course: Pt. with PMH of dementia, COPD, HTN, mood disorder, GERD, PE, RCC, B12 deficiency, chronic BPH, chronic indwelling Foley catheter; admitted on 09/28/2018, presented with complaint of shortness of breath, was found to have A. fib with RVR, tachycardia induced cardiomyopathy, UTI and sepsis. Currently further plan is continuing current care and discuss goals of care.  Subjective: Patient is agitated.  Family reports the speech is slurred.  No other acute complaints reported by family.  Assessment and Plan: 1.  Sepsis secondary to UTI Presented with leukocytosis, lactic acidosis, tachycardia and tachypnea with shortness of breath. Suspected healthcare assistant pneumonia although chest x-ray appears more likely volume overload than pneumonia. Initially started on IV vancomycin and cefepime, will transition him to IV ceftriaxone and azithromycin. UTI is more likely the cause with urine culture growing E. coli sensitivities currently pending. Blood cultures currently negative. Closely monitor the patient.  2.  Acute encephalopathy-metabolic Delirium in the setting of dementia and UTI and sepsis. Use PRN Haldol and scheduled Seroquel.  If not controlled may use lorazepam. Concern for possible CVA by the family, will get CT of the head. Speech therapy consulted PT OT consulted. Monitor  3.  Paroxysmal A. fib with RVR. Tachycardia induced cardiomyopathy. Acute systolic CHF. Presented with A. fib with RVR. Received IV fluid bolus 2.5 L in the ER. Overnight patient received IV Cardizem. Echocardiogram yesterday shows EF of 25% diffuse hypokinesis. Suspect this is tachycardia induced cardiomyopathy as the patient does have history of prior atrial  tachycardia. Discontinue Cardizem, use scheduled IV Lopressor. Discussed with cardiology, currently recommend rate control. Anticoagulation is also recommended awaiting report of the CT scan of the head.  4.  Essential hypertension. Not on any antihypertensive at home. On scheduled Lopressor now.  5.  COPD. Acute hypoxia. Does not appear to have any flutter for now. Hypoxia is likely secondary to atelectasis and volume overload. Continue current care for now.  6.  Tinea pedis. Onychomycosis. Continue topical treatment for now.  7.  Dementia. Current encephalopathy is secondary to delirium We will monitor.  8.  Dilutional pancytopenia. Patient's WBC is normal, hemoglobin has dropped as well as platelet has dropped compared to yesterday.  This is likely secondary to IV fluids.  We will continue to monitor.  Diet: NPO DVT Prophylaxis: subcutaneous Heparin  Advance goals of care discussion: DNR DNI  Family Communication: family was present at bedside, at the time of interview. Opportunity was given to ask question and all questions were answered satisfactorily.   Disposition:  Discharge to be determined.  Consultants: none Procedures: Echocardiogram  Scheduled Meds: . aspirin  81 mg Oral Daily  . clotrimazole   Topical BID  . feeding supplement (ENSURE ENLIVE)  237 mL Oral BID BM  . heparin  5,000 Units Subcutaneous Q8H  . insulin aspart  0-9 Units Subcutaneous TID WC  . metoprolol tartrate  5 mg Intravenous Q6H  . QUEtiapine  25 mg Oral QHS   Continuous Infusions: . [START ON 09/30/2018] azithromycin    . cefTRIAXone (ROCEPHIN)  IV 1 g (09/29/18 0925)   PRN Meds: acetaminophen, acetaminophen, haloperidol lactate, LORazepam, metoprolol tartrate, ondansetron (ZOFRAN) IV Antibiotics: Anti-infectives (From admission, onward)   Start     Dose/Rate Route Frequency Ordered Stop  09/30/18 0900  azithromycin (ZITHROMAX) 500 mg in sodium chloride 0.9 % 250 mL IVPB     500  mg 250 mL/hr over 60 Minutes Intravenous Every 24 hours 09/29/18 0938     09/29/18 1000  fluconazole (DIFLUCAN) tablet 100 mg  Status:  Discontinued     100 mg Oral Daily 09/29/18 0838 09/29/18 0949   09/29/18 1000  azithromycin (ZITHROMAX) tablet 500 mg  Status:  Discontinued     500 mg Oral Daily 09/29/18 0839 09/29/18 0938   09/29/18 0900  cefTRIAXone (ROCEPHIN) 1 g in sodium chloride 0.9 % 100 mL IVPB     1 g 200 mL/hr over 30 Minutes Intravenous Every 24 hours 09/29/18 0838     09/28/18 2330  vancomycin (VANCOCIN) 500 mg in sodium chloride 0.9 % 100 mL IVPB  Status:  Discontinued     500 mg 100 mL/hr over 60 Minutes Intravenous Every 12 hours 09/28/18 1034 09/29/18 0836   09/28/18 1930  ceFEPIme (MAXIPIME) 1 g in sodium chloride 0.9 % 100 mL IVPB  Status:  Discontinued     1 g 200 mL/hr over 30 Minutes Intravenous Every 8 hours 09/28/18 1034 09/29/18 0836   09/28/18 1500  fluconazole (DIFLUCAN) IVPB 100 mg  Status:  Discontinued     100 mg 50 mL/hr over 60 Minutes Intravenous Every 24 hours 09/28/18 1446 09/29/18 0836   09/28/18 1000  vancomycin (VANCOCIN) IVPB 1000 mg/200 mL premix     1,000 mg 200 mL/hr over 60 Minutes Intravenous  Once 09/28/18 0951 09/28/18 1304   09/28/18 1000  ceFEPIme (MAXIPIME) 2 g in sodium chloride 0.9 % 100 mL IVPB     2 g 200 mL/hr over 30 Minutes Intravenous  Once 09/28/18 0951 09/28/18 1234       Objective: Physical Exam: Vitals:   09/28/18 1719 09/28/18 2308 09/29/18 0800 09/29/18 0925  BP: 113/75 (!) 138/97 114/79 114/79  Pulse: 90 96 (!) 43 (!) 117  Resp: (!) 24 17 17    Temp: (!) 97.5 F (36.4 C) (!) 97.4 F (36.3 C)    TempSrc: Oral Oral    SpO2: 97% 96% 96%   Weight:      Height:        Intake/Output Summary (Last 24 hours) at 09/29/2018 1337 Last data filed at 09/29/2018 0925 Gross per 24 hour  Intake 1786.02 ml  Output 350 ml  Net 1436.02 ml   Filed Weights   09/28/18 1000  Weight: 68.9 kg   General: Alert, Awake and not  Oriented. Appear in marked distress, affect labile Eyes: PERRL, Conjunctiva normal ENT: Oral Mucosa clear moist. Neck: difficult to assess  JVD, no Abnormal Mass Or lumps Cardiovascular: S1 and S2 Present, no Murmur, Peripheral Pulses Present Respiratory: increased respiratory effort, Bilateral Air entry equal and Decreased, no use of accessory muscle, bilateral basal Crackles, no wheezes Abdomen: Bowel Sound present, Soft and no tenderness, no hernia Skin: no redness, no Rash, no induration Extremities: no Pedal edema, no calf tenderness Neurologic: Grossly no focal neuro deficit.  Difficult to assess detailed examination due to pt agitation and lack of cooperation   Data Reviewed: CBC: Recent Labs  Lab 09/28/18 0804 09/28/18 0931 09/29/18 0330  WBC 12.7*  --  8.4  NEUTROABS 11.1*  --  6.7  HGB 14.0 15.6 12.7*  HCT 45.8 46.0 41.8  MCV 88.8  --  87.1  PLT 144*  --  867*   Basic Metabolic Panel: Recent Labs  Lab 09/28/18 0804 09/28/18  [id number] 09/28/18 1521 09/28/18 2208 09/29/18 0330  NA 138 139  --   --  139  K 3.5 3.6  --   --  3.9  CL 103 99  --   --  109  CO2 26  --   --   --  24  GLUCOSE 215* 172*  --   --  104*  BUN 11 14  --   --  11  CREATININE 1.11 0.90  --   --  1.00  CALCIUM 8.8*  --   --   --  8.8*  MG  --   --  2.4 2.2  --   PHOS  --   --   --  2.9  --     Liver Function Tests: Recent Labs  Lab 09/29/18 0330  AST 17  ALT 10  ALKPHOS 65  BILITOT 1.4*  PROT 6.0*  ALBUMIN 3.1*   No results for input(s): LIPASE, AMYLASE in the last 168 hours. No results for input(s): AMMONIA in the last 168 hours. Coagulation Profile: Recent Labs  Lab 09/28/18 0804  INR 1.25   Cardiac Enzymes: Recent Labs  Lab 09/28/18 0804 09/28/18 1521 09/28/18 2208  TROPONINI 0.05* 0.07* 0.08*   BNP (last 3 results) No results for input(s): PROBNP in the last 8760 hours. CBG: Recent Labs  Lab 09/28/18 1306 09/28/18 1801 09/28/18 2111 09/29/18 0822 09/29/18 1229   GLUCAP 97 93 97 99 112*   Studies: No results found.   Time spent: 35 minutes  Author: Berle Mull, MD [facility] Pager: [phone] 09/29/2018 1:37 PM  Between 7PM-7AM, please contact night-coverage at [email or web address], password [email or web address]

## 2018-09-29 NOTE — Progress Notes (Addendum)
Initial Nutrition Assessment  DOCUMENTATION CODES:  Severe malnutrition in context of chronic illness  INTERVENTION:  Recommend re-checking b12. Pt with poor intake for past several months and has refused oral/supplements vitamins. Also has documented hx of B12 def.  Recommend liberalization of diet to atleast Heart Healthy. He is on Consistent Carb diet, though BG has been WDL/no hx DM  Boost Breeze po TID, each supplement provides 250 kcal and 9 grams of protein  Will order choc ice cream with meals (a favorite food of pt)  Liquid MVI with minerals.   NUTRITION DIAGNOSIS:  Severe Malnutrition related to chronic illness, poor appetite (End stage dementia) as evidenced by severe muscle/fat loss.   GOAL:  Patient will meet greater than or equal to 90% of their needs  MONITOR:  PO intake, Supplement acceptance, Labs, Weight trends, I & O's  REASON FOR ASSESSMENT:  Malnutrition Screening Tool    ASSESSMENT:  84 y/o male PMHx end stage dementia, COPD, HTN, Mood disorder, RCC, GERD, PE, B12 deficiency, esophageal stricture, Chronic Foley catheter. Brought to ED due to dyspnea and chest pain. Found to have new Afib w/ RVR and HCAP. Admitted for management.   Spouse at bedside. She is only a fair historian. She reports the patients intake has been declining over the past "couple months". She says he "wont eat almost nothing". She goes on to describe a history consistent with progressing dementia. She notes increased behaviors and gradually decreasing portion sizes.   At baseline, pt apparently has some element of dysphagia, spouse says pt has had esophageal dilations in past. However, at home he was more or less on a regular diet w/o altered consistencies, though he favored softer items of course. He did not take any vitamins and refused to drinks Ensure/Boost.    Spouse feels pt has lost weight, but cannot quantify or say over what period the loss occurred.  She believes his weight was  156-157 lbs "a few months ago" She says "I bet he is 146 lbs right now. Bed weight was 155 lbs. Per chart, he was 146 in August, 152 in May and 158 in April. He was 157 at this time last year. Pt has no apparent sig wt changes.   At this time, pt is refusing meds/food. SHe knows this is normal course of dementia. She says pt has stated he does not want a feeding tube. Would not recommend this in End-stage dementia anyway. Will add choc icecream to meals. Can try Boost breeze as individuals with dementia tend to have affinity for sweeter items. Would also recommend checking b12 given his hx of deficiency, poor intake and no supplementation.   Labs: Bg: 90-115, Albumin: 3.1, Hgb: 12.7 Meds: Seroquel, Insulin, IV abx, IV ativan  Recent Labs  Lab 09/28/18 0804 09/28/18 0931 09/28/18 1521 09/28/18 2208 09/29/18 0330  NA 138 139  --   --  139  K 3.5 3.6  --   --  3.9  CL 103 99  --   --  109  CO2 26  --   --   --  24  BUN 11 14  --   --  11  CREATININE 1.11 0.90  --   --  1.00  CALCIUM 8.8*  --   --   --  8.8*  MG  --   --  2.4 2.2  --   PHOS  --   --   --  2.9  --   GLUCOSE 215* 172*  --   --  104*   NUTRITION - FOCUSED PHYSICAL EXAM:   Most Recent Value  Orbital Region  Moderate depletion  Upper Arm Region  Mild depletion  Thoracic and Lumbar Region  Severe depletion  Buccal Region  Moderate depletion  Temple Region  Severe depletion  Clavicle Bone Region  Severe depletion  Clavicle and Acromion Bone Region  Moderate depletion  Scapular Bone Region  Unable to assess  Dorsal Hand  Unable to assess  Patellar Region  Severe depletion  Anterior Thigh Region  Severe depletion  Posterior Calf Region  Moderate depletion  Edema (RD Assessment)  None  Hair  Reviewed  Eyes  Reviewed  Mouth  Reviewed  Skin  Reviewed  Nails  Reviewed     Diet Order:   Diet Order            Diet heart healthy/carb modified Room service appropriate? Yes; Fluid consistency: Thin  Diet effective now              EDUCATION NEEDS:  No education needs have been identified at this time  Skin: Cracking/fissure (funfal infection)-bilateral foot  Last BM:  Unknown  Height:  Ht Readings from Last 1 Encounters:  09/28/18 6\' 1"  (1.854 m)   Weight:  Wt Readings from Last 1 Encounters:  09/28/18 68.9 kg   Wt Readings from Last 10 Encounters:  09/28/18 68.9 kg  07/13/18 66.2 kg  04/16/18 68.9 kg  03/13/18 71.8 kg  10/30/17 71.2 kg  06/14/17 70.8 kg  10/18/16 74.8 kg  06/08/16 74.4 kg  05/20/15 71.2 kg  05/13/14 77.6 kg   Ideal Body Weight:  83.64 kg  BMI:  Body mass index is 20.05 kg/m.  Estimated Nutritional Needs:  Kcal:  1950-2200 (28-32 kcal/bw) Protein:  96-110g Pro (1.4-1.6g/kg ibw) Fluid:  1.9-2.2 L fluid  Burtis Junes RD, LDN, CNSC Clinical Nutrition Available Tues-Sat via Pager: [phone] 09/29/2018 3:06 PM

## 2018-09-30 LAB — URINE CULTURE

## 2018-09-30 LAB — GLUCOSE, CAPILLARY
GLUCOSE-CAPILLARY: 106 mg/dL — AB (ref 70–99)
Glucose-Capillary: 103 mg/dL — ABNORMAL HIGH (ref 70–99)
Glucose-Capillary: 109 mg/dL — ABNORMAL HIGH (ref 70–99)

## 2018-09-30 LAB — BASIC METABOLIC PANEL
ANION GAP: 12 (ref 5–15)
BUN: 20 mg/dL (ref 8–23)
CALCIUM: 9.3 mg/dL (ref 8.9–10.3)
CO2: 23 mmol/L (ref 22–32)
Chloride: 106 mmol/L (ref 98–111)
Creatinine, Ser: 1.18 mg/dL (ref 0.61–1.24)
GFR calc Af Amer: >60 mL/min (ref >60)
GFR calc non Af Amer: 52 mL/min — ABNORMAL LOW (ref >60)
GLUCOSE: 104 mg/dL — AB (ref 70–99)
Potassium: 4.2 mmol/L (ref 3.5–5.1)
Sodium: 141 mmol/L (ref 135–145)

## 2018-09-30 LAB — CBC WITH DIFFERENTIAL/PLATELET
Abs Immature Granulocytes: 0.02 10*3/uL (ref 0.00–0.07)
Basophils Absolute: 0 10*3/uL (ref 0.0–0.1)
Basophils Relative: 0 %
EOS ABS: 0 10*3/uL (ref 0.0–0.5)
Eosinophils Relative: 0 %
HCT: 44.4 % (ref 39.0–52.0)
Hemoglobin: 13.3 g/dL (ref 13.0–17.0)
IMMATURE GRANULOCYTES: 0 %
LYMPHS ABS: 0.7 10*3/uL (ref 0.7–4.0)
Lymphocytes Relative: 10 %
MCH: 26.3 pg (ref 26.0–34.0)
MCHC: 30 g/dL (ref 30.0–36.0)
MCV: 87.7 fL (ref 80.0–100.0)
Monocytes Absolute: 0.5 10*3/uL (ref 0.1–1.0)
Monocytes Relative: 8 %
NEUTROS PCT: 82 %
Neutro Abs: 5.8 10*3/uL (ref 1.7–7.7)
Platelets: 106 10*3/uL — ABNORMAL LOW (ref 150–400)
RBC: 5.06 MIL/uL (ref 4.22–5.81)
RDW: 14.8 % (ref 11.5–15.5)
WBC: 7.1 10*3/uL (ref 4.0–10.5)
nRBC: 0 % (ref 0.0–0.2)

## 2018-09-30 LAB — MAGNESIUM: Magnesium: 2.2 mg/dL (ref 1.7–2.4)

## 2018-09-30 MED ORDER — HALOPERIDOL LACTATE 2 MG/ML PO CONC
0.5000 mg | ORAL | Status: DC | PRN
  Filled 2018-09-30: qty 0.3

## 2018-09-30 MED ORDER — HALOPERIDOL 1 MG PO TABS: 5.0000 mg | ORAL_TABLET | ORAL | Status: DC | PRN

## 2018-09-30 MED ORDER — LORAZEPAM 2 MG/ML IJ SOLN: 1.0000 mg | INTRAMUSCULAR | Status: DC | PRN

## 2018-09-30 MED ORDER — LORAZEPAM 2 MG/ML IJ SOLN
0.5000 mg | Freq: Three times a day (TID) | INTRAMUSCULAR | Status: DC
  Administered 2018-09-30 – 2018-10-01 (×2): 0.5 mg via INTRAVENOUS
  Filled 2018-09-30 (×2): qty 1

## 2018-09-30 MED ORDER — HALOPERIDOL LACTATE 5 MG/ML IJ SOLN: 0.5000 mg | INTRAMUSCULAR | Status: DC | PRN

## 2018-09-30 MED ORDER — LORAZEPAM 1 MG PO TABS: 1.0000 mg | ORAL_TABLET | ORAL | Status: DC | PRN

## 2018-09-30 MED ORDER — SODIUM CHLORIDE 0.9% FLUSH: 3.0000 mL | INTRAVENOUS | Status: DC | PRN

## 2018-09-30 MED ORDER — HALOPERIDOL 0.5 MG PO TABS: 0.5000 mg | ORAL_TABLET | ORAL | Status: DC | PRN

## 2018-09-30 MED ORDER — HALOPERIDOL LACTATE 5 MG/ML IJ SOLN
2.0000 mg | INTRAMUSCULAR | Status: DC | PRN
  Administered 2018-10-01: 2 mg via INTRAVENOUS
  Filled 2018-09-30: qty 1

## 2018-09-30 MED ORDER — SODIUM CHLORIDE 0.9% FLUSH
3.0000 mL | Freq: Two times a day (BID) | INTRAVENOUS | Status: DC
  Administered 2018-09-30 – 2018-10-01 (×3): 3 mL via INTRAVENOUS

## 2018-09-30 MED ORDER — ONDANSETRON 4 MG PO TBDP: 4.0000 mg | ORAL_TABLET | Freq: Four times a day (QID) | ORAL | Status: DC | PRN

## 2018-09-30 MED ORDER — ONDANSETRON HCL 4 MG/2ML IJ SOLN: 4.0000 mg | Freq: Four times a day (QID) | INTRAMUSCULAR | Status: DC | PRN

## 2018-09-30 MED ORDER — HYDROMORPHONE HCL 1 MG/ML IJ SOLN
0.5000 mg | INTRAMUSCULAR | Status: DC | PRN
  Administered 2018-09-30 – 2018-10-01 (×2): 0.5 mg via INTRAVENOUS
  Filled 2018-09-30 (×2): qty 1

## 2018-09-30 MED ORDER — ACETAMINOPHEN 325 MG PO TABS: 650.0000 mg | ORAL_TABLET | Freq: Four times a day (QID) | ORAL | Status: DC | PRN

## 2018-09-30 MED ORDER — ACETAMINOPHEN 650 MG RE SUPP: 650.0000 mg | Freq: Four times a day (QID) | RECTAL | Status: DC | PRN

## 2018-09-30 MED ORDER — SODIUM CHLORIDE 0.9 % IV SOLN: 250.0000 mL | INTRAVENOUS | Status: DC | PRN

## 2018-09-30 MED ORDER — HALOPERIDOL LACTATE 2 MG/ML PO CONC
5.0000 mg | ORAL | Status: DC | PRN
  Filled 2018-09-30: qty 2.5

## 2018-09-30 MED ORDER — METOPROLOL TARTRATE 5 MG/5ML IV SOLN
5.0000 mg | Freq: Three times a day (TID) | INTRAVENOUS | Status: DC
  Administered 2018-09-30 – 2018-10-02 (×6): 5 mg via INTRAVENOUS
  Filled 2018-09-30 (×6): qty 5

## 2018-09-30 MED ORDER — LORAZEPAM 2 MG/ML PO CONC: 1.0000 mg | ORAL | Status: DC | PRN

## 2018-09-30 NOTE — Progress Notes (Signed)
[facility] Progress Note  Patient: Bryan David OXB:[id number]   PCP: Viviana Simpler I, MD DOB: Jun 24, 1925   DOA: 09/28/2018   DOS: 09/30/2018   Date of Service: the patient was seen and examined on 09/30/2018  Brief hospital course: Pt. with PMH of dementia, COPD, HTN, mood disorder, GERD, PE, RCC, B12 deficiency, chronic BPH, chronic indwelling Foley catheter; admitted on 09/28/2018, presented with complaint of shortness of breath, was found to have A. fib with RVR, tachycardia induced cardiomyopathy, UTI and sepsis. Currently further plan is continuing current care and discuss goals of care.  Subjective: Patient was agitated throughout yesterday.  Received Haldol and now sleepy and lethargic.  Heart rate remains tachycardic despite scheduled Lopressor.  Assessment and Plan: 1.  Sepsis secondary to UTI UTI associated with chronic indwelling Foley catheter. Presented with leukocytosis, lactic acidosis, tachycardia and tachypnea with shortness of breath. Suspected healthcare associated pneumonia although chest x-ray appears more likely volume overload than pneumonia. Initially started on IV vancomycin and cefepime, we transition him to IV ceftriaxone and azithromycin. UTI is more likely the cause with urine culture growing E. coli sensitivities are available. Blood cultures currently negative.  2.  Acute encephalopathy-metabolic Delirium in the setting of dementia and UTI and sepsis. Used PRN Haldol and scheduled Seroquel.   Concern for possible CVA by the family, CT of the head was negative for any acute stroke or injury. Speech therapy consulted PT OT consulted.  3.  Paroxysmal A. fib with RVR. Tachycardia induced cardiomyopathy. Acute systolic CHF. Presented with A. fib with RVR. Received IV fluid bolus 2.5 L in the ER. Overnight patient received IV Cardizem. Echocardiogram shows EF of 25% diffuse hypokinesis. Suspect this is tachycardia induced cardiomyopathy as the  patient does have history of prior atrial tachycardia. Discontinue Cardizem, use scheduled IV Lopressor. Discussed with cardiology, currently recommend rate control. Anticoagulation was recommended if rhythm control was attempted.  4.  Essential hypertension. Not on any antihypertensive at home. On scheduled Lopressor now.  5.  COPD. Acute hypoxia. Does not appear to have any flutter for now. Hypoxia is likely secondary to atelectasis and volume overload. Continue current care for now.  6.  Tinea pedis. Onychomycosis. Continue topical treatment for now.  7.  Advance mixed Alzheimer and vascular dementia. With behavioral disturbances. Current encephalopathy is secondary to delirium We will monitor.  8.  Dilutional pancytopenia. Patient's WBC is normal, hemoglobin has dropped as well as platelet has dropped compared to yesterday.  This is likely secondary to IV fluids.  We will continue to monitor.  9.  Decreased urine output. Acute kidney injury. Patient has 300 cc urine output and initial 24-hour, 200 cc last 24-hour. Secondary to poor p.o. intake. See goals of care discussion.  10.  Goals of care discussion. Had an extensive discussion with family.  Patient's daughter and wife at bedside. Patient with prior history of advanced dementia presented to the hospital with shortness of breath found to have A. fib with RVR, cardiomyopathy with a EF of 25%, acute hypoxic respiratory failure secondary to acute on chronic combined CHF, sepsis due to E. coli UTI secondary to chronic indwelling Foley catheter and acute kidney injury due to poor p.o. intake. Patient has severe delirium in the setting of sepsis and metabolic encephalopathy requiring multiple rounds of IV Ativan and Haldol. Patient is at high risk for aspiration. Given all his multiple comorbidities patient's prognosis is guarded. With the need for IV Ativan and Haldol patient's alertness has worsened significantly in  the  setting of delirium, AKI and sepsis.  Renal function is worsening due to poor p.o. intake. Given all this findings I anticipate that with the best treatment the patient's prognosis is less than 74month and with negligible oral intake prognosis remains less than 2 weeks. At this point I discussed with family regarding their goal of care and they are leaning towards comfort with hospice. Patient lives with wife who is elderly and frail. With patient's severe agitation requiring multiple rounds of IV sedatives as well as poor p.o. intake with prognosis less than 2 weeks recommended residential hospice. Family lives close to [facility], would like to transition to hospice at [facility]. Social worker consulted. Medication changed.  Diet: Comfort feed DVT Prophylaxis: Comfort care  Advance goals of care discussion: DNR DNI, comfort care  Family Communication: family was present at bedside, at the time of interview. Opportunity was given to ask question and all questions were answered satisfactorily.   Disposition:  Discharge to residential hospice facility  Consultants: Palliative care Procedures: Echocardiogram  Scheduled Meds: . clotrimazole   Topical BID  . feeding supplement  1 Container Oral TID BM  . LORazepam  0.5 mg Intravenous Q8H  . sodium chloride flush  3 mL Intravenous Q12H   Continuous Infusions: . sodium chloride     PRN Meds: sodium chloride, acetaminophen **OR** acetaminophen, haloperidol **OR** haloperidol **OR** haloperidol lactate, HYDROmorphone (DILAUDID) injection, LORazepam **OR** LORazepam **OR** LORazepam, LORazepam, ondansetron **OR** ondansetron (ZOFRAN) IV, sodium chloride flush Antibiotics: Anti-infectives (From admission, onward)   Start     Dose/Rate Route Frequency Ordered Stop   09/30/18 0900  azithromycin (ZITHROMAX) 500 mg in sodium chloride 0.9 % 250 mL IVPB  Status:  Discontinued     500 mg 250 mL/hr over 60 Minutes Intravenous Every 24 hours  09/29/18 0938 09/30/18 1046   09/29/18 1000  fluconazole (DIFLUCAN) tablet 100 mg  Status:  Discontinued     100 mg Oral Daily 09/29/18 0838 09/29/18 0949   09/29/18 1000  azithromycin (ZITHROMAX) tablet 500 mg  Status:  Discontinued     500 mg Oral Daily 09/29/18 0839 09/29/18 0938   09/29/18 0900  cefTRIAXone (ROCEPHIN) 1 g in sodium chloride 0.9 % 100 mL IVPB  Status:  Discontinued     1 g 200 mL/hr over 30 Minutes Intravenous Every 24 hours 09/29/18 0838 09/30/18 1046   09/28/18 2330  vancomycin (VANCOCIN) 500 mg in sodium chloride 0.9 % 100 mL IVPB  Status:  Discontinued     500 mg 100 mL/hr over 60 Minutes Intravenous Every 12 hours 09/28/18 1034 09/29/18 0836   09/28/18 1930  ceFEPIme (MAXIPIME) 1 g in sodium chloride 0.9 % 100 mL IVPB  Status:  Discontinued     1 g 200 mL/hr over 30 Minutes Intravenous Every 8 hours 09/28/18 1034 09/29/18 0836   09/28/18 1500  fluconazole (DIFLUCAN) IVPB 100 mg  Status:  Discontinued     100 mg 50 mL/hr over 60 Minutes Intravenous Every 24 hours 09/28/18 1446 09/29/18 0836   09/28/18 1000  vancomycin (VANCOCIN) IVPB 1000 mg/200 mL premix     1,000 mg 200 mL/hr over 60 Minutes Intravenous  Once 09/28/18 0951 09/28/18 1304   09/28/18 1000  ceFEPIme (MAXIPIME) 2 g in sodium chloride 0.9 % 100 mL IVPB     2 g 200 mL/hr over 30 Minutes Intravenous  Once 09/28/18 0951 09/28/18 1234       Objective: Physical Exam: Vitals:   09/29/18 0925 09/29/18 1712  09/30/18 0044 09/30/18 0735  BP: 114/79 (!) 133/98 (!) 142/94 (!) 136/104  Pulse: (!) 117 85 97 (!) 121  Resp:  16  18  Temp:  (!) 97.5 F (36.4 C) 98.1 F (36.7 C) 97.7 F (36.5 C)  TempSrc:  Oral Axillary Oral  SpO2:  93% 95% 95%  Weight:      Height:        Intake/Output Summary (Last 24 hours) at 09/30/2018 1218 Last data filed at 09/30/2018 1044 Gross per 24 hour  Intake 350 ml  Output 225 ml  Net 125 ml   Filed Weights   09/28/18 1000  Weight: 68.9 kg   General: Alert, Awake  and not Oriented. Appear in marked distress, affect labile Eyes: PERRL, Conjunctiva normal ENT: Oral Mucosa clear moist. Neck: difficult to assess  JVD, no Abnormal Mass Or lumps Cardiovascular: S1 and S2 Present, no Murmur, Peripheral Pulses Present Respiratory: increased respiratory effort, Bilateral Air entry equal and Decreased, no use of accessory muscle, bilateral basal Crackles, no wheezes Abdomen: Bowel Sound present, Soft and no tenderness, no hernia Skin: no redness, no Rash, no induration Extremities: no Pedal edema, no calf tenderness Neurologic: Grossly no focal neuro deficit.  Difficult to assess detailed examination due to pt agitation and lack of cooperation   Data Reviewed: CBC: Recent Labs  Lab 09/28/18 0804 09/28/18 0931 09/29/18 0330 09/30/18 0238  WBC 12.7*  --  8.4 7.1  NEUTROABS 11.1*  --  6.7 5.8  HGB 14.0 15.6 12.7* 13.3  HCT 45.8 46.0 41.8 44.4  MCV 88.8  --  87.1 87.7  PLT 144*  --  104* 678*   Basic Metabolic Panel: Recent Labs  Lab 09/28/18 0804 09/28/18 0931 09/28/18 1521 09/28/18 2208 09/29/18 0330 09/30/18 0238  NA 138 139  --   --  139 141  K 3.5 3.6  --   --  3.9 4.2  CL 103 99  --   --  109 106  CO2 26  --   --   --  24 23  GLUCOSE 215* 172*  --   --  104* 104*  BUN 11 14  --   --  11 20  CREATININE 1.11 0.90  --   --  1.00 1.18  CALCIUM 8.8*  --   --   --  8.8* 9.3  MG  --   --  2.4 2.2  --  2.2  PHOS  --   --   --  2.9  --   --     Liver Function Tests: Recent Labs  Lab 09/29/18 0330  AST 17  ALT 10  ALKPHOS 65  BILITOT 1.4*  PROT 6.0*  ALBUMIN 3.1*   No results for input(s): LIPASE, AMYLASE in the last 168 hours. No results for input(s): AMMONIA in the last 168 hours. Coagulation Profile: Recent Labs  Lab 09/28/18 0804  INR 1.25   Cardiac Enzymes: Recent Labs  Lab 09/28/18 0804 09/28/18 1521 09/28/18 2208  TROPONINI 0.05* 0.07* 0.08*   BNP (last 3 results) No results for input(s): PROBNP in the last 8760  hours. CBG: Recent Labs  Lab 09/29/18 0822 09/29/18 1229 09/29/18 1714 09/30/18 0039 09/30/18 0735  GLUCAP 99 112* 105* 109* 106*   Studies: Ct Head Wo Contrast  Result Date: 09/29/2018 CLINICAL DATA:  Altered mental status EXAM: CT HEAD WITHOUT CONTRAST TECHNIQUE: Contiguous axial images were obtained from the base of the skull through the vertex without intravenous contrast. COMPARISON:  Head  CT 02/18/2018 FINDINGS: Studies severely motion degraded. Brain: There is no mass, hemorrhage or extra-axial collection. There is generalized atrophy without lobar predilection. There is no acute or chronic infarction. There is hypoattenuation of the periventricular white matter, most commonly indicating chronic ischemic microangiopathy. Vascular: No abnormal hyperdensity of the major intracranial arteries or dural venous sinuses. No intracranial atherosclerosis. Skull: The visualized skull base, calvarium and extracranial soft tissues are normal. Sinuses/Orbits: No fluid levels or advanced mucosal thickening of the visualized paranasal sinuses. No mastoid or middle ear effusion. The orbits are normal. IMPRESSION: Chronic ischemic microangiopathy and generalized atrophy. No acute abnormality. Electronically Signed   By: Ulyses Jarred M.D.   On: 09/29/2018 12:57     Time spent: 35 minutes  Author: Berle Mull, MD [facility] Pager: [phone] 09/30/2018 12:18 PM  Between 7PM-7AM, please contact night-coverage at [email or web address], password [facility]

## 2018-09-30 NOTE — Progress Notes (Signed)
MD notified this Probation officer of pt now being residential hospice appropriate and family requests [facility]. CSW contacted facility and faxed referral. Facility will call back covering CSW if a bed becomes avaliable for placement. Will continue to assist as needed.

## 2018-09-30 NOTE — Plan of Care (Signed)
Patient is confused and combative.  He does not communicate well.  Wife and daughter at bedside.  No teach back displayed.

## 2018-09-30 NOTE — Progress Notes (Signed)
SLP Cancellation Note  Patient Details Name: Terrelle Ruffolo MRN: [id number] DOB: 22-May-1925   Cancelled treatment:        Cancelled due to Lethargy. MD discussing comfort care with family.   Charlynne Cousins Analina Filla, MA, CCC-SLP 09/30/2018 10:20 AM

## 2018-10-01 LAB — GLUCOSE, CAPILLARY
Glucose-Capillary: 95 mg/dL (ref 70–99)
Glucose-Capillary: 102 mg/dL — ABNORMAL HIGH (ref 70–99)

## 2018-10-01 MED ORDER — LORAZEPAM 2 MG/ML IJ SOLN
0.5000 mg | Freq: Four times a day (QID) | INTRAMUSCULAR | Status: DC
  Administered 2018-10-01 – 2018-10-02 (×4): 0.5 mg via INTRAVENOUS
  Filled 2018-10-01 (×4): qty 1

## 2018-10-01 MED ORDER — HALOPERIDOL LACTATE 5 MG/ML IJ SOLN
2.0000 mg | Freq: Three times a day (TID) | INTRAMUSCULAR | Status: DC
  Administered 2018-10-01 (×2): 2 mg via INTRAVENOUS
  Filled 2018-10-01 (×3): qty 1

## 2018-10-01 MED ORDER — HALOPERIDOL LACTATE 5 MG/ML IJ SOLN
2.0000 mg | Freq: Two times a day (BID) | INTRAMUSCULAR | Status: DC
  Administered 2018-10-01: 2 mg via INTRAVENOUS
  Filled 2018-10-01: qty 1

## 2018-10-01 NOTE — Care Management Important Message (Signed)
Important Message  Patient Details  Name: Bryan David MRN: [id number] Date of Birth: [birth year]   Medicare Important Message Given:  Yes    Orbie Pyo 10/01/2018, 3:28 PM

## 2018-10-01 NOTE — Clinical Social Work Note (Signed)
If pt is stable for transfer in [place] can take pt. Marcene Brawn with [facility] asking for 10:45 AM transport. MD notified. RN can call report to [phone].    [place], [place]

## 2018-10-01 NOTE — Progress Notes (Signed)
[facility] Progress Note  Patient: Bryan David FOY:[id number]   PCP: Viviana Simpler I, MD DOB: 1925/10/09   DOA: 09/28/2018   DOS: 10/01/2018   Date of Service: the patient was seen and examined on 10/01/2018  Brief hospital course: Pt. with PMH of dementia, COPD, HTN, mood disorder, GERD, PE, RCC, B12 deficiency, chronic BPH, chronic indwelling Foley catheter; admitted on 09/28/2018, presented with complaint of shortness of breath, was found to have A. fib with RVR, tachycardia induced cardiomyopathy, UTI and sepsis. Currently further plan is continuing current care and discuss goals of care.  Subjective: Continues to have agitation.  No nausea no vomiting.  No fever no chills.  Assessment and Plan: 1.  Sepsis secondary to UTI UTI associated with chronic indwelling Foley catheter. Presented with leukocytosis, lactic acidosis, tachycardia and tachypnea with shortness of breath. Suspected healthcare associated pneumonia although chest x-ray appears more likely volume overload than pneumonia. Initially started on IV vancomycin and cefepime, we transition him to IV ceftriaxone and azithromycin. UTI is more likely the cause with urine culture growing E. coli sensitivities are available. Blood cultures currently negative.  2.  Acute encephalopathy-metabolic Delirium in the setting of dementia and UTI and sepsis. Used PRN Haldol and scheduled Seroquel.   Concern for possible CVA by the family, CT of the head was negative for any acute stroke or injury. Speech therapy consulted PT OT consulted. With continuous agitation will use scheduled Haldol.  Increase the frequency of scheduled Ativan as well.  Patient seen pulling out on Foley catheter.  3.  Paroxysmal A. fib with RVR. Tachycardia induced cardiomyopathy. Acute systolic CHF. Presented with A. fib with RVR. Received IV fluid bolus 2.5 L in the ER. Overnight patient received IV Cardizem. Echocardiogram shows EF of 25%  diffuse hypokinesis. Suspect this is tachycardia induced cardiomyopathy as the patient does have history of prior atrial tachycardia. Discontinue Cardizem, use scheduled IV Lopressor. Discussed with cardiology, currently recommend rate control. Anticoagulation was recommended if rhythm control was attempted.  4.  Essential hypertension. Not on any antihypertensive at home. On scheduled Lopressor now.  5.  COPD. Acute hypoxia. Does not appear to have any flutter for now. Hypoxia is likely secondary to atelectasis and volume overload. Continue current care for now.  6.  Tinea pedis. Onychomycosis. Continue topical treatment for now.  7.  Advance mixed Alzheimer and vascular dementia. With behavioral disturbances. Current encephalopathy is secondary to delirium We will monitor.  8.  Dilutional pancytopenia. Patient's WBC is normal, hemoglobin has dropped as well as platelet has dropped compared to yesterday.  This is likely secondary to IV fluids.  We will continue to monitor.  9.  Decreased urine output. Acute kidney injury. Patient has 300 cc urine output and initial 24-hour, 200 cc last 24-hour. Secondary to poor p.o. intake. See goals of care discussion.  10.  Goals of care discussion. Had an extensive discussion with family.  Patient's daughter and wife at bedside. Patient with prior history of advanced dementia presented to the hospital with shortness of breath found to have A. fib with RVR, cardiomyopathy with a EF of 25%, acute hypoxic respiratory failure secondary to acute on chronic combined CHF, sepsis due to E. coli UTI secondary to chronic indwelling Foley catheter and acute kidney injury due to poor p.o. intake. Patient has severe delirium in the setting of sepsis and metabolic encephalopathy requiring multiple rounds of IV Ativan and Haldol. Patient is at high risk for aspiration. Given all his multiple comorbidities patient's  prognosis is guarded. With the need  for IV Ativan and Haldol patient's alertness has worsened significantly in the setting of delirium, AKI and sepsis.  Renal function is worsening due to poor p.o. intake. Given all this findings I anticipate that with the best treatment the patient's prognosis is less than 6month and with negligible oral intake prognosis remains less than 2 weeks. At this point I discussed with family regarding their goal of care and they are leaning towards comfort with hospice. Patient lives with wife who is elderly and frail. With patient's severe agitation requiring multiple rounds of IV sedatives as well as poor p.o. intake with prognosis less than 2 weeks recommended residential hospice. Family lives close to [place], would like to transition to hospice at [facility]. Social worker consulted. Medication changed.  Diet: Comfort feed DVT Prophylaxis: Comfort care  Advance goals of care discussion: DNR DNI, comfort care  Family Communication: family was present at bedside, at the time of interview. Opportunity was given to ask question and all questions were answered satisfactorily.   Disposition:  Discharge to residential hospice facility  Consultants: Palliative care Procedures: Echocardiogram  Scheduled Meds: . clotrimazole   Topical BID  . feeding supplement  1 Container Oral TID BM  . haloperidol lactate  2 mg Intravenous TID  . LORazepam  0.5 mg Intravenous Q6H  . metoprolol tartrate  5 mg Intravenous Q8H  . sodium chloride flush  3 mL Intravenous Q12H   Continuous Infusions: . sodium chloride     PRN Meds: sodium chloride, acetaminophen **OR** acetaminophen, haloperidol **OR** haloperidol **OR** haloperidol lactate, HYDROmorphone (DILAUDID) injection, LORazepam **OR** LORazepam **OR** LORazepam, LORazepam, ondansetron **OR** ondansetron (ZOFRAN) IV, sodium chloride flush Antibiotics: Anti-infectives (From admission, onward)   Start     Dose/Rate Route Frequency Ordered Stop    09/30/18 0900  azithromycin (ZITHROMAX) 500 mg in sodium chloride 0.9 % 250 mL IVPB  Status:  Discontinued     500 mg 250 mL/hr over 60 Minutes Intravenous Every 24 hours 09/29/18 0938 09/30/18 1046   09/29/18 1000  fluconazole (DIFLUCAN) tablet 100 mg  Status:  Discontinued     100 mg Oral Daily 09/29/18 0838 09/29/18 0949   09/29/18 1000  azithromycin (ZITHROMAX) tablet 500 mg  Status:  Discontinued     500 mg Oral Daily 09/29/18 0839 09/29/18 0938   09/29/18 0900  cefTRIAXone (ROCEPHIN) 1 g in sodium chloride 0.9 % 100 mL IVPB  Status:  Discontinued     1 g 200 mL/hr over 30 Minutes Intravenous Every 24 hours 09/29/18 0838 09/30/18 1046   09/28/18 2330  vancomycin (VANCOCIN) 500 mg in sodium chloride 0.9 % 100 mL IVPB  Status:  Discontinued     500 mg 100 mL/hr over 60 Minutes Intravenous Every 12 hours 09/28/18 1034 09/29/18 0836   09/28/18 1930  ceFEPIme (MAXIPIME) 1 g in sodium chloride 0.9 % 100 mL IVPB  Status:  Discontinued     1 g 200 mL/hr over 30 Minutes Intravenous Every 8 hours 09/28/18 1034 09/29/18 0836   09/28/18 1500  fluconazole (DIFLUCAN) IVPB 100 mg  Status:  Discontinued     100 mg 50 mL/hr over 60 Minutes Intravenous Every 24 hours 09/28/18 1446 09/29/18 0836   09/28/18 1000  vancomycin (VANCOCIN) IVPB 1000 mg/200 mL premix     1,000 mg 200 mL/hr over 60 Minutes Intravenous  Once 09/28/18 0951 09/28/18 1304   09/28/18 1000  ceFEPIme (MAXIPIME) 2 g in sodium chloride 0.9 % 100 mL  IVPB     2 g 200 mL/hr over 30 Minutes Intravenous  Once 09/28/18 0951 09/28/18 1234       Objective: Physical Exam: Vitals:   09/30/18 0735 09/30/18 1800 10/01/18 0612 10/01/18 0832  BP: (!) 136/104 (!) 135/96  (!) 134/94  Pulse: (!) 121 92 (!) 125 76  Resp: 18 14    Temp: 97.7 F (36.5 C)     TempSrc: Oral     SpO2: 95% 96%  98%  Weight:      Height:        Intake/Output Summary (Last 24 hours) at 10/01/2018 1741 Last data filed at 10/01/2018 1027 Gross per 24 hour    Intake 3 ml  Output -  Net 3 ml   Filed Weights   09/28/18 1000  Weight: 68.9 kg   General: Alert, Awake and not Oriented. Appear in marked distress, affect labile Eyes: PERRL, Conjunctiva normal ENT: Oral Mucosa clear moist. Neck: difficult to assess  JVD, no Abnormal Mass Or lumps Cardiovascular: S1 and S2 Present, no Murmur, Peripheral Pulses Present Respiratory: increased respiratory effort, Bilateral Air entry equal and Decreased, no use of accessory muscle, bilateral basal Crackles, no wheezes Abdomen: Bowel Sound present, Soft and no tenderness, no hernia Skin: no redness, no Rash, no induration Extremities: no Pedal edema, no calf tenderness Neurologic: Grossly no focal neuro deficit.  Difficult to assess detailed examination due to pt agitation and lack of cooperation   Data Reviewed: CBC: Recent Labs  Lab 09/28/18 0804 09/28/18 0931 09/29/18 0330 09/30/18 0238  WBC 12.7*  --  8.4 7.1  NEUTROABS 11.1*  --  6.7 5.8  HGB 14.0 15.6 12.7* 13.3  HCT 45.8 46.0 41.8 44.4  MCV 88.8  --  87.1 87.7  PLT 144*  --  104* 528*   Basic Metabolic Panel: Recent Labs  Lab 09/28/18 0804 09/28/18 0931 09/28/18 1521 09/28/18 2208 09/29/18 0330 09/30/18 0238  NA 138 139  --   --  139 141  K 3.5 3.6  --   --  3.9 4.2  CL 103 99  --   --  109 106  CO2 26  --   --   --  24 23  GLUCOSE 215* 172*  --   --  104* 104*  BUN 11 14  --   --  11 20  CREATININE 1.11 0.90  --   --  1.00 1.18  CALCIUM 8.8*  --   --   --  8.8* 9.3  MG  --   --  2.4 2.2  --  2.2  PHOS  --   --   --  2.9  --   --     Liver Function Tests: Recent Labs  Lab 09/29/18 0330  AST 17  ALT 10  ALKPHOS 65  BILITOT 1.4*  PROT 6.0*  ALBUMIN 3.1*   No results for input(s): LIPASE, AMYLASE in the last 168 hours. No results for input(s): AMMONIA in the last 168 hours. Coagulation Profile: Recent Labs  Lab 09/28/18 0804  INR 1.25   Cardiac Enzymes: Recent Labs  Lab 09/28/18 0804 09/28/18 1521  09/28/18 2208  TROPONINI 0.05* 0.07* 0.08*   BNP (last 3 results) No results for input(s): PROBNP in the last 8760 hours. CBG: Recent Labs  Lab 09/30/18 0039 09/30/18 0735 09/30/18 1218 10/01/18 0743 10/01/18 1639  GLUCAP 109* 106* 103* 95 102*   Studies: No results found.   Time spent: 35 minutes  Author: Diamantina Providence  Posey Pronto, MD [facility] Pager: [phone] 10/01/2018 5:41 PM  Between 7PM-7AM, please contact night-coverage at [email or web address], password [facility]

## 2018-10-02 NOTE — Progress Notes (Signed)
   10/02/18 0930  Clinical Encounter Type  Visited With Patient  Visit Type Initial  Referral From Palliative care team  Consult/Referral To Chaplain  Chaplain noted Pt. transition to [facility] today. Chaplain attempted spiritual care visit with Pt. and/or family. Family was not present. Pt. did not awaken to several calls of his name.

## 2018-10-02 NOTE — Discharge Summary (Signed)
[facility] Discharge Summary   Patient: Bryan David ZOX:[id number]   PCP: Venia Carbon, MD DOB: 04-13-1925   Date of admission: 09/28/2018   Date of discharge:  10/02/2018    Discharge Diagnoses:  Principal diagnosis Sepsis secondary to E coli UTI UTI associated with chronic indwelling Foley catheter. Principal Problem:   HCAP (healthcare-associated pneumonia) Active Problems:   Essential hypertension, benign   COPD (chronic obstructive pulmonary disease) (HCC)   GERD (gastroesophageal reflux disease)   Paroxysmal atrial fibrillation (HCC)   Lactic acidosis   UTI (urinary tract infection)   Tinea pedis of both feet   Onychomycosis of all toenails   Hyperglycemia   Protein-calorie malnutrition, severe  Admitted From: home Disposition:  Residential hospice at [facility]   Recommendations for Outpatient Follow-up:  1. Please establish care with hospice    Diet recommendation: comfort feeds  Activity: The patient is advised to gradually reintroduce usual activities.  Discharge Condition: stable  Code Status: DNR DNI  History of present illness: As per the H and P dictated on admission, "Bryan David is a 84 y.o. male with medical history significant of anxiety, osteoarthritis, history of gallstone pancreatitis, choledocholithiasis, bladder cancer, carpal tunnel syndrome, COPD, dementia, diverticulosis, GERD, esophageal stricture, hiatal hernia, hypertension, mood disorder, history of pulmonary embolism, history of pneumonia, renal cancer, vitamin B12 deficiency who is brought to the emergency department due to dyspnea and pleuritic chest pain.  He was found to be on A. fib with RVR when EMS arrived with a heart rate of 189 bpm.  He was given 5 mg of metoprolol prior to arrival to the emergency department.  His heart rhythm was still irregular and his heart rate decreased to the 90s to 110s.  He does not take any medications at home and will refuse to take  them, as per his wife and daughter.  They state that his mental status has been declining over the past few months."  Hospital Course:  Summary of his active problems in the hospital is as following. 1.  Sepsis secondary to UTI UTI associated with chronic indwelling Foley catheter. Presented with leukocytosis, lactic acidosis, tachycardia and tachypnea with shortness of breath. Suspected healthcare associated pneumonia although chest x-ray appears more likely volume overload than pneumonia. Initially started on IV vancomycin and cefepime, we transition him to IV ceftriaxone and azithromycin. UTI is more likely the cause with urine culture growing E. coli sensitivities are available. Blood cultures currently negative. With comfort being the goal, Antibiotics were stopped.   2.  Acute encephalopathy-metabolic Delirium in the setting of dementia and UTI and sepsis. Used PRN Haldol and scheduled Seroquel.   Concern for possible CVA by the family, CT of the head was negative for any acute stroke or injury. Speech therapy consulted PT OT consulted. With continuous agitation will use scheduled Haldol.  Increase the frequency of scheduled Ativan as well.  Patient seen trying to pull out on Foley catheter.  3.  Paroxysmal A. fib with RVR. Tachycardia induced cardiomyopathy. Acute systolic CHF. Presented with A. fib with RVR. Received IV fluid bolus 2.5 L in the ER. Overnight patient received IV Cardizem. Echocardiogram shows EF of 25% diffuse hypokinesis. Suspect this is tachycardia induced cardiomyopathy as the patient does have history of prior atrial tachycardia. Discontinue Cardizem, use scheduled IV Lopressor. Discussed with cardiology, currently recommend rate control. Anticoagulation was recommended if rhythm control was attempted.  4.  Essential hypertension. Not on any antihypertensive at home. Pt was treated with  IV scheduled Lopressor .  5.  COPD. Acute hypoxia. Does not  appear to have any flare ups for now. Hypoxia is likely secondary to atelectasis and volume overload. Continue current care for now.  6.  Tinea pedis. Onychomycosis. Hold topical treatment for now.  7.  Advance mixed Alzheimer and vascular dementia. With behavioral disturbances. Current encephalopathy is secondary to delirium For agitation we have been using scheduled lorazepam and haldol.   8.  Dilutional pancytopenia. Patient's WBC is normal, hemoglobin has dropped as well as platelet has dropped compared to yesterday.  This is likely secondary to IV fluids.  We will continue to monitor.  9.  Decreased urine output. Acute kidney injury. Patient has 300 cc urine output and initial 24-hour, 200 cc last 24-hour. Secondary to poor p.o. intake. See goals of care discussion.  10.  Goals of care discussion. Had an extensive discussion with family.  Patient's daughter and wife at bedside. Patient with prior history of advanced dementia presented to the hospital with shortness of breath found to have A. fib with RVR, cardiomyopathy with a EF of 25%, acute hypoxic respiratory failure secondary to acute on chronic combined CHF, sepsis due to E. coli UTI secondary to chronic indwelling Foley catheter and acute kidney injury due to poor p.o. intake. Patient has severe delirium in the setting of sepsis and metabolic encephalopathy requiring multiple rounds of IV Ativan and Haldol. Patient is at high risk for aspiration. Given all his multiple comorbidities patient's prognosis is guarded. With the need for IV Ativan and Haldol patient's alertness has worsened significantly in the setting of delirium, AKI and sepsis.  Renal function is worsening due to poor p.o. intake. Given all this findings I anticipate that with the best treatment the patient's prognosis is less than 87month and with negligible oral intake prognosis remains less than 2 weeks. At this point I discussed with family regarding  their goal of care and they are leaning towards comfort with hospice. Patient lives with wife who is elderly and frail. With patient's severe agitation requiring multiple rounds of IV sedatives as well as poor p.o. intake with prognosis less than 2 weeks recommended residential hospice. Family lives close to [place], would like to transition to hospice at [facility]. Social worker consulted. Medication changed.  All other chronic medical condition were stable during the hospitalization.  On the day of the discharge the patient's symptoms were controlled, and no other acute medical condition were reported by patient. the patient was felt safe to be discharge at residential hospce.  Consultants: Palliative care  Procedures: Echocardiogram   DISCHARGE MEDICATION: Allergies as of 10/02/2018      Reactions   Acetaminophen Other (See Comments)   "made him feel silly"      Medication List    STOP taking these medications   aspirin EC 81 MG tablet   cephALEXin 250 MG/5ML suspension Commonly known as:  KEFLEX   cephALEXin 500 MG capsule Commonly known as:  KEFLEX   memantine 5 MG tablet Commonly known as:  NAMENDA   urea 40 % Crea Commonly known as:  CARMOL      Allergies  Allergen Reactions  . Acetaminophen Other (See Comments)    "made him feel silly"   Discharge Instructions    Increase activity slowly   Complete by:  As directed    May have comfort feeds from floor stock   Complete by:  As directed      Discharge Exam: Luther Weights  09/28/18 1000  Weight: 68.9 kg   Vitals:   10/01/18 0612 10/01/18 0832  BP:  (!) 134/94  Pulse: (!) 125 76  Resp:    Temp:    SpO2:  98%   General: Appear in moderate distress, no Rash; Oral Mucosa moist. Cardiovascular: S1 and S2 Present, aortic systolic  Murmur, difficult to assess  JVD Respiratory: Bilateral Air entry present and bilateral basal Crackles, no wheezes Abdomen: Bowel Sound present, Soft and no  tenderness Extremities: no Pedal edema, on calf tenderness Neurology: Grossly no focal neuro deficit.  The results of significant diagnostics from this hospitalization (including imaging, microbiology, ancillary and laboratory) are listed below for reference.    Significant Diagnostic Studies: Ct Head Wo Contrast  Result Date: 09/29/2018 CLINICAL DATA:  Altered mental status EXAM: CT HEAD WITHOUT CONTRAST TECHNIQUE: Contiguous axial images were obtained from the base of the skull through the vertex without intravenous contrast. COMPARISON:  Head CT 02/18/2018 FINDINGS: Studies severely motion degraded. Brain: There is no mass, hemorrhage or extra-axial collection. There is generalized atrophy without lobar predilection. There is no acute or chronic infarction. There is hypoattenuation of the periventricular white matter, most commonly indicating chronic ischemic microangiopathy. Vascular: No abnormal hyperdensity of the major intracranial arteries or dural venous sinuses. No intracranial atherosclerosis. Skull: The visualized skull base, calvarium and extracranial soft tissues are normal. Sinuses/Orbits: No fluid levels or advanced mucosal thickening of the visualized paranasal sinuses. No mastoid or middle ear effusion. The orbits are normal. IMPRESSION: Chronic ischemic microangiopathy and generalized atrophy. No acute abnormality. Electronically Signed   By: Ulyses Jarred M.D.   On: 09/29/2018 12:57   Dg Chest Port 1 View  Result Date: 09/28/2018 CLINICAL DATA:  New onset atrial fibrillation. EXAM: PORTABLE CHEST 1 VIEW COMPARISON:  Chest x-ray dated July 26, 2012. FINDINGS: New cardiomegaly with pulmonary vascular congestion and mild interstitial edema. Atherosclerotic calcification of the aortic arch. Small left greater than right pleural effusions with left lower lobe consolidation/atelectasis. Mild right basilar subsegmental atelectasis. No pneumothorax. No acute osseous abnormality.  IMPRESSION: 1. New cardiomegaly and mild interstitial edema with small bilateral pleural effusions. 2. Left lower lobe consolidation versus atelectasis. Electronically Signed   By: Titus Dubin M.D.   On: 09/28/2018 08:53    Microbiology: Recent Results (from the past 240 hour(s))  Blood Culture (routine x 2)     Status: None (Preliminary result)   Collection Time: 09/28/18  9:50 AM  Result Value Ref Range Status   Specimen Description BLOOD BLOOD RIGHT FOREARM  Final   Special Requests   Final    BOTTLES DRAWN AEROBIC AND ANAEROBIC Blood Culture adequate volume   Culture   Final    NO GROWTH 3 DAYS Performed at [facility], [address], [place], [place] [address]    Report Status PENDING  Incomplete  Urine culture     Status: Abnormal   Collection Time: 09/28/18  9:51 AM  Result Value Ref Range Status   Specimen Description URINE, CLEAN CATCH  Final   Special Requests   Final    NONE Performed at [facility], [place] [address] [place], [place] [address]    Culture >=100,000 COLONIES/mL ESCHERICHIA COLI (A)  Final   Report Status 09/30/2018 FINAL  Final   Organism ID, Bacteria ESCHERICHIA COLI (A)  Final      Susceptibility   Escherichia coli - MIC*    AMPICILLIN >=32 RESISTANT Resistant     CEFAZOLIN <=4 SENSITIVE  Sensitive     CEFTRIAXONE <=1 SENSITIVE Sensitive     CIPROFLOXACIN >=4 RESISTANT Resistant     GENTAMICIN >=16 RESISTANT Resistant     IMIPENEM <=0.25 SENSITIVE Sensitive     NITROFURANTOIN <=16 SENSITIVE Sensitive     TRIMETH/SULFA <=20 SENSITIVE Sensitive     AMPICILLIN/SULBACTAM >=32 RESISTANT Resistant     PIP/TAZO <=4 SENSITIVE Sensitive     Extended ESBL NEGATIVE Sensitive     * >=100,000 COLONIES/mL ESCHERICHIA COLI  Blood Culture (routine x 2)     Status: None (Preliminary result)   Collection Time: 09/28/18  9:55 AM  Result Value Ref Range Status   Specimen Description BLOOD BLOOD LEFT FOREARM  Final   Special Requests   Final     BOTTLES DRAWN AEROBIC AND ANAEROBIC Blood Culture results may not be optimal due to an excessive volume of blood received in culture bottles   Culture   Final    NO GROWTH 3 DAYS Performed at [facility], [place] [address], [place], [place] [address]    Report Status PENDING  Incomplete     Labs: CBC: Recent Labs  Lab 09/28/18 0804 09/28/18 0931 09/29/18 0330 09/30/18 0238  WBC 12.7*  --  8.4 7.1  NEUTROABS 11.1*  --  6.7 5.8  HGB 14.0 15.6 12.7* 13.3  HCT 45.8 46.0 41.8 44.4  MCV 88.8  --  87.1 87.7  PLT 144*  --  104* 389*   Basic Metabolic Panel: Recent Labs  Lab 09/28/18 0804 09/28/18 0931 09/28/18 1521 09/28/18 2208 09/29/18 0330 09/30/18 0238  NA 138 139  --   --  139 141  K 3.5 3.6  --   --  3.9 4.2  CL 103 99  --   --  109 106  CO2 26  --   --   --  24 23  GLUCOSE 215* 172*  --   --  104* 104*  BUN 11 14  --   --  11 20  CREATININE 1.11 0.90  --   --  1.00 1.18  CALCIUM 8.8*  --   --   --  8.8* 9.3  MG  --   --  2.4 2.2  --  2.2  PHOS  --   --   --  2.9  --   --    Liver Function Tests: Recent Labs  Lab 09/29/18 0330  AST 17  ALT 10  ALKPHOS 65  BILITOT 1.4*  PROT 6.0*  ALBUMIN 3.1*   No results for input(s): LIPASE, AMYLASE in the last 168 hours. No results for input(s): AMMONIA in the last 168 hours. Cardiac Enzymes: Recent Labs  Lab 09/28/18 0804 09/28/18 1521 09/28/18 2208  TROPONINI 0.05* 0.07* 0.08*   BNP (last 3 results) Recent Labs    09/28/18 0804  BNP 667.9*   CBG: Recent Labs  Lab 09/30/18 0039 09/30/18 0735 09/30/18 1218 10/01/18 0743 10/01/18 1639  GLUCAP 109* 106* 103* 95 102*   Time spent: 35 minutes  Signed:  Berle Mull  [facility]  10/02/2018  , 11:05 AM

## 2018-10-02 NOTE — Progress Notes (Addendum)
This RN called report to [place] at [facility] this morning. Pt is scheduled for transport this morning.   Per nurse request at Hospice home, Pt will be dc'd with both PIVs placed here on 10/25.

## 2018-10-02 NOTE — Consult Note (Signed)
            [facility] CM Primary Care Navigator  10/02/2018  Bryan David Oct 22, 1925 [id number]   Went to see patient at the bedside to identify possible discharge needs but noted on MD note that discussion with family was done and they are leaning towards comfort care with hospice given patient's prognosis less than 2 weeks.   Patient will transition to a residential hospice facility at discharge. Family lives close to [place] and would like to transition to hospice at [facility].   Per inpatient social worker note, patient will discharge to [facility].  Noted no further [facility] care management needs identified at this point.   For additional questions please contact:  Edwena Felty A. Manaia Samad, BSN, RN-BC [facility] PRIMARY CARE Navigator Cell: [phone]

## 2018-10-02 NOTE — Clinical Social Work Note (Signed)
Clinical Social Worker facilitated patient discharge including contacting patient family and facility to confirm patient discharge plans.  Clinical information faxed to facility and family agreeable with plan.  CSW arranged ambulance transport via [facility] (10:45 AM) to [facility].  RN to call [phone] for report prior to discharge.  Clinical Social Worker will sign off for now as social work intervention is no longer needed. Please consult [place] again if new need arises.  [place], [place]

## 2018-10-02 NOTE — Discharge Summary (Signed)
[facility] Discharge Summary   Patient: Bryan David ZOX:[id number]   PCP: Venia Carbon, MD DOB: 1924/12/31   Date of admission: 09/28/2018   Date of discharge:  10/02/2018    Discharge Diagnoses:  Principal diagnosis Sepsis secondary to E coli UTI UTI associated with chronic indwelling Foley catheter. Principal Problem:   HCAP (healthcare-associated pneumonia) Active Problems:   Essential hypertension, benign   COPD (chronic obstructive pulmonary disease) (HCC)   GERD (gastroesophageal reflux disease)   Paroxysmal atrial fibrillation (HCC)   Lactic acidosis   UTI (urinary tract infection)   Tinea pedis of both feet   Onychomycosis of all toenails   Hyperglycemia   Protein-calorie malnutrition, severe  Admitted From: home Disposition:  Residential hospice at [facility]   Recommendations for Outpatient Follow-up:  1. Please establish care with hospice    Diet recommendation: comfort feeds  Activity: The patient is advised to gradually reintroduce usual activities.  Discharge Condition: stable  Code Status: DNR DNI  History of present illness: As per the H and P dictated on admission, "Bryan David is a 84 y.o. male with medical history significant of anxiety, osteoarthritis, history of gallstone pancreatitis, choledocholithiasis, bladder cancer, carpal tunnel syndrome, COPD, dementia, diverticulosis, GERD, esophageal stricture, hiatal hernia, hypertension, mood disorder, history of pulmonary embolism, history of pneumonia, renal cancer, vitamin B12 deficiency who is brought to the emergency department due to dyspnea and pleuritic chest pain.  He was found to be on A. fib with RVR when EMS arrived with a heart rate of 189 bpm.  He was given 5 mg of metoprolol prior to arrival to the emergency department.  His heart rhythm was still irregular and his heart rate decreased to the 90s to 110s.  He does not take any medications at home and will refuse to take  them, as per his wife and daughter.  They state that his mental status has been declining over the past few months."  Hospital Course:  Summary of his active problems in the hospital is as following. 1.  Sepsis secondary to UTI UTI associated with chronic indwelling Foley catheter. Presented with leukocytosis, lactic acidosis, tachycardia and tachypnea with shortness of breath. Suspected healthcare associated pneumonia although chest x-ray appears more likely volume overload than pneumonia. Initially started on IV vancomycin and cefepime, we transition him to IV ceftriaxone and azithromycin. UTI is more likely the cause with urine culture growing E. coli sensitivities are available. Blood cultures currently negative. With comfort being the goal, Antibiotics were stopped.   2.  Acute encephalopathy-metabolic Delirium in the setting of dementia and UTI and sepsis. Used PRN Haldol and scheduled Seroquel.   Concern for possible CVA by the family, CT of the head was negative for any acute stroke or injury. Speech therapy consulted PT OT consulted. With continuous agitation will use scheduled Haldol.  Increase the frequency of scheduled Ativan as well.  Patient seen trying to pull out on Foley catheter.  3.  Paroxysmal A. fib with RVR. Tachycardia induced cardiomyopathy. Acute systolic CHF. Presented with A. fib with RVR. Received IV fluid bolus 2.5 L in the ER. Overnight patient received IV Cardizem. Echocardiogram shows EF of 25% diffuse hypokinesis. Suspect this is tachycardia induced cardiomyopathy as the patient does have history of prior atrial tachycardia. Discontinue Cardizem, use scheduled IV Lopressor. Discussed with cardiology, currently recommend rate control. Anticoagulation was recommended if rhythm control was attempted.  4.  Essential hypertension. Not on any antihypertensive at home. Pt was treated with  IV scheduled Lopressor .  5.  COPD. Acute hypoxia. Does not  appear to have any flare ups for now. Hypoxia is likely secondary to atelectasis and volume overload. Continue current care for now.  6.  Tinea pedis. Onychomycosis. Hold topical treatment for now.  7.  Advance mixed Alzheimer and vascular dementia. With behavioral disturbances. Current encephalopathy is secondary to delirium For agitation we have been using scheduled lorazepam and haldol.   8.  Dilutional pancytopenia. Patient's WBC is normal, hemoglobin has dropped as well as platelet has dropped compared to yesterday.  This is likely secondary to IV fluids.  We will continue to monitor.  9.  Decreased urine output. Acute kidney injury. Patient has 300 cc urine output and initial 24-hour, 200 cc last 24-hour. Secondary to poor p.o. intake. See goals of care discussion.  10.  Goals of care discussion. Had an extensive discussion with family.  Patient's daughter and wife at bedside. Patient with prior history of advanced dementia presented to the hospital with shortness of breath found to have A. fib with RVR, cardiomyopathy with a EF of 25%, acute hypoxic respiratory failure secondary to acute on chronic combined CHF, sepsis due to E. coli UTI secondary to chronic indwelling Foley catheter and acute kidney injury due to poor p.o. intake. Patient has severe delirium in the setting of sepsis and metabolic encephalopathy requiring multiple rounds of IV Ativan and Haldol. Patient is at high risk for aspiration. Given all his multiple comorbidities patient's prognosis is guarded. With the need for IV Ativan and Haldol patient's alertness has worsened significantly in the setting of delirium, AKI and sepsis.  Renal function is worsening due to poor p.o. intake. Given all this findings I anticipate that with the best treatment the patient's prognosis is less than 53month and with negligible oral intake prognosis remains less than 2 weeks. At this point I discussed with family regarding  their goal of care and they are leaning towards comfort with hospice. Patient lives with wife who is elderly and frail. With patient's severe agitation requiring multiple rounds of IV sedatives as well as poor p.o. intake with prognosis less than 2 weeks recommended residential hospice. Family lives close to [place], would like to transition to hospice at [facility]. Social worker consulted. Medication changed.  All other chronic medical condition were stable during the hospitalization.  On the day of the discharge the patient's symptoms were controlled, and no other acute medical condition were reported by patient. the patient was felt safe to be discharge at residential hospce.  Consultants: Palliative care  Procedures: Echocardiogram   DISCHARGE MEDICATION: Allergies as of 10/02/2018      Reactions   Acetaminophen Other (See Comments)   "made him feel silly"      Medication List    STOP taking these medications   aspirin EC 81 MG tablet   cephALEXin 250 MG/5ML suspension Commonly known as:  KEFLEX   cephALEXin 500 MG capsule Commonly known as:  KEFLEX   memantine 5 MG tablet Commonly known as:  NAMENDA   urea 40 % Crea Commonly known as:  CARMOL      Allergies  Allergen Reactions  . Acetaminophen Other (See Comments)    "made him feel silly"   Discharge Instructions    Increase activity slowly   Complete by:  As directed    May have comfort feeds from floor stock   Complete by:  As directed      Discharge Exam: [place] Weights  09/28/18 1000  Weight: 68.9 kg   Vitals:   10/01/18 0612 10/01/18 0832  BP:  (!) 134/94  Pulse: (!) 125 76  Resp:    Temp:    SpO2:  98%   General: Appear in moderate distress, no Rash; Oral Mucosa moist. Cardiovascular: S1 and S2 Present, aortic systolic  Murmur, difficult to assess  JVD Respiratory: Bilateral Air entry present and bilateral basal Crackles, no wheezes Abdomen: Bowel Sound present, Soft and no  tenderness Extremities: no Pedal edema, on calf tenderness Neurology: Grossly no focal neuro deficit.  The results of significant diagnostics from this hospitalization (including imaging, microbiology, ancillary and laboratory) are listed below for reference.    Significant Diagnostic Studies: Ct Head Wo Contrast  Result Date: 09/29/2018 CLINICAL DATA:  Altered mental status EXAM: CT HEAD WITHOUT CONTRAST TECHNIQUE: Contiguous axial images were obtained from the base of the skull through the vertex without intravenous contrast. COMPARISON:  Head CT 02/18/2018 FINDINGS: Studies severely motion degraded. Brain: There is no mass, hemorrhage or extra-axial collection. There is generalized atrophy without lobar predilection. There is no acute or chronic infarction. There is hypoattenuation of the periventricular white matter, most commonly indicating chronic ischemic microangiopathy. Vascular: No abnormal hyperdensity of the major intracranial arteries or dural venous sinuses. No intracranial atherosclerosis. Skull: The visualized skull base, calvarium and extracranial soft tissues are normal. Sinuses/Orbits: No fluid levels or advanced mucosal thickening of the visualized paranasal sinuses. No mastoid or middle ear effusion. The orbits are normal. IMPRESSION: Chronic ischemic microangiopathy and generalized atrophy. No acute abnormality. Electronically Signed   By: Ulyses Jarred M.D.   On: 09/29/2018 12:57   Dg Chest Port 1 View  Result Date: 09/28/2018 CLINICAL DATA:  New onset atrial fibrillation. EXAM: PORTABLE CHEST 1 VIEW COMPARISON:  Chest x-ray dated July 26, 2012. FINDINGS: New cardiomegaly with pulmonary vascular congestion and mild interstitial edema. Atherosclerotic calcification of the aortic arch. Small left greater than right pleural effusions with left lower lobe consolidation/atelectasis. Mild right basilar subsegmental atelectasis. No pneumothorax. No acute osseous abnormality.  IMPRESSION: 1. New cardiomegaly and mild interstitial edema with small bilateral pleural effusions. 2. Left lower lobe consolidation versus atelectasis. Electronically Signed   By: Titus Dubin M.D.   On: 09/28/2018 08:53    Microbiology: Recent Results (from the past 240 hour(s))  Blood Culture (routine x 2)     Status: None (Preliminary result)   Collection Time: 09/28/18  9:50 AM  Result Value Ref Range Status   Specimen Description BLOOD BLOOD RIGHT FOREARM  Final   Special Requests   Final    BOTTLES DRAWN AEROBIC AND ANAEROBIC Blood Culture adequate volume   Culture   Final    NO GROWTH 3 DAYS Performed at [facility], [address], [place], [place] [address]    Report Status PENDING  Incomplete  Urine culture     Status: Abnormal   Collection Time: 09/28/18  9:51 AM  Result Value Ref Range Status   Specimen Description URINE, CLEAN CATCH  Final   Special Requests   Final    NONE Performed at [facility], [place] [address], [place], [place] [address]    Culture >=100,000 COLONIES/mL ESCHERICHIA COLI (A)  Final   Report Status 09/30/2018 FINAL  Final   Organism ID, Bacteria ESCHERICHIA COLI (A)  Final      Susceptibility   Escherichia coli - MIC*    AMPICILLIN >=32 RESISTANT Resistant     CEFAZOLIN <=4 SENSITIVE  Sensitive     CEFTRIAXONE <=1 SENSITIVE Sensitive     CIPROFLOXACIN >=4 RESISTANT Resistant     GENTAMICIN >=16 RESISTANT Resistant     IMIPENEM <=0.25 SENSITIVE Sensitive     NITROFURANTOIN <=16 SENSITIVE Sensitive     TRIMETH/SULFA <=20 SENSITIVE Sensitive     AMPICILLIN/SULBACTAM >=32 RESISTANT Resistant     PIP/TAZO <=4 SENSITIVE Sensitive     Extended ESBL NEGATIVE Sensitive     * >=100,000 COLONIES/mL ESCHERICHIA COLI  Blood Culture (routine x 2)     Status: None (Preliminary result)   Collection Time: 09/28/18  9:55 AM  Result Value Ref Range Status   Specimen Description BLOOD BLOOD LEFT FOREARM  Final   Special Requests   Final     BOTTLES DRAWN AEROBIC AND ANAEROBIC Blood Culture results may not be optimal due to an excessive volume of blood received in culture bottles   Culture   Final    NO GROWTH 3 DAYS Performed at [facility], [place] [address] [place], [place] [address]    Report Status PENDING  Incomplete     Labs: CBC: Recent Labs  Lab 09/28/18 0804 09/28/18 0931 09/29/18 0330 09/30/18 0238  WBC 12.7*  --  8.4 7.1  NEUTROABS 11.1*  --  6.7 5.8  HGB 14.0 15.6 12.7* 13.3  HCT 45.8 46.0 41.8 44.4  MCV 88.8  --  87.1 87.7  PLT 144*  --  104* 694*   Basic Metabolic Panel: Recent Labs  Lab 09/28/18 0804 09/28/18 0931 09/28/18 1521 09/28/18 2208 09/29/18 0330 09/30/18 0238  NA 138 139  --   --  139 141  K 3.5 3.6  --   --  3.9 4.2  CL 103 99  --   --  109 106  CO2 26  --   --   --  24 23  GLUCOSE 215* 172*  --   --  104* 104*  BUN 11 14  --   --  11 20  CREATININE 1.11 0.90  --   --  1.00 1.18  CALCIUM 8.8*  --   --   --  8.8* 9.3  MG  --   --  2.4 2.2  --  2.2  PHOS  --   --   --  2.9  --   --    Liver Function Tests: Recent Labs  Lab 09/29/18 0330  AST 17  ALT 10  ALKPHOS 65  BILITOT 1.4*  PROT 6.0*  ALBUMIN 3.1*   No results for input(s): LIPASE, AMYLASE in the last 168 hours. No results for input(s): AMMONIA in the last 168 hours. Cardiac Enzymes: Recent Labs  Lab 09/28/18 0804 09/28/18 1521 09/28/18 2208  TROPONINI 0.05* 0.07* 0.08*   BNP (last 3 results) Recent Labs    09/28/18 0804  BNP 667.9*   CBG: Recent Labs  Lab 09/30/18 0039 09/30/18 0735 09/30/18 1218 10/01/18 0743 10/01/18 1639  GLUCAP 109* 106* 103* 95 102*   Time spent: 35 minutes  Signed:  Berle Mull  [facility]  10/02/2018  , 7:38 AM

## 2018-10-03 ENCOUNTER — Telehealth: Payer: Self-pay

## 2018-10-03 LAB — CULTURE, BLOOD (ROUTINE X 2)
CULTURE: NO GROWTH
Culture: NO GROWTH
SPECIAL REQUESTS: ADEQUATE

## 2018-10-03 NOTE — Telephone Encounter (Signed)
FYI:  Per epic note from recent hospitalization for patient (D/C 10/02/18) Hospice care was discussed. Family agreed to go that route. Left message to follow up on patient, is he in Hospice care now? Do we need to help getting this organized.Kris Mouton, RMA

## 2018-10-05 DEATH — deceased

## 2018-10-26 ENCOUNTER — Encounter: Payer: Medicare Other | Admitting: Internal Medicine

## 2019-06-22 IMAGING — DX DG CHEST 1V PORT
1 series · 1 of 1 positions shown · non-contrast
Comparison: Chest x-ray dated July 26, 2012.

CLINICAL DATA: New onset atrial fibrillation.

EXAM:
PORTABLE CHEST 1 VIEW

[chest ap]
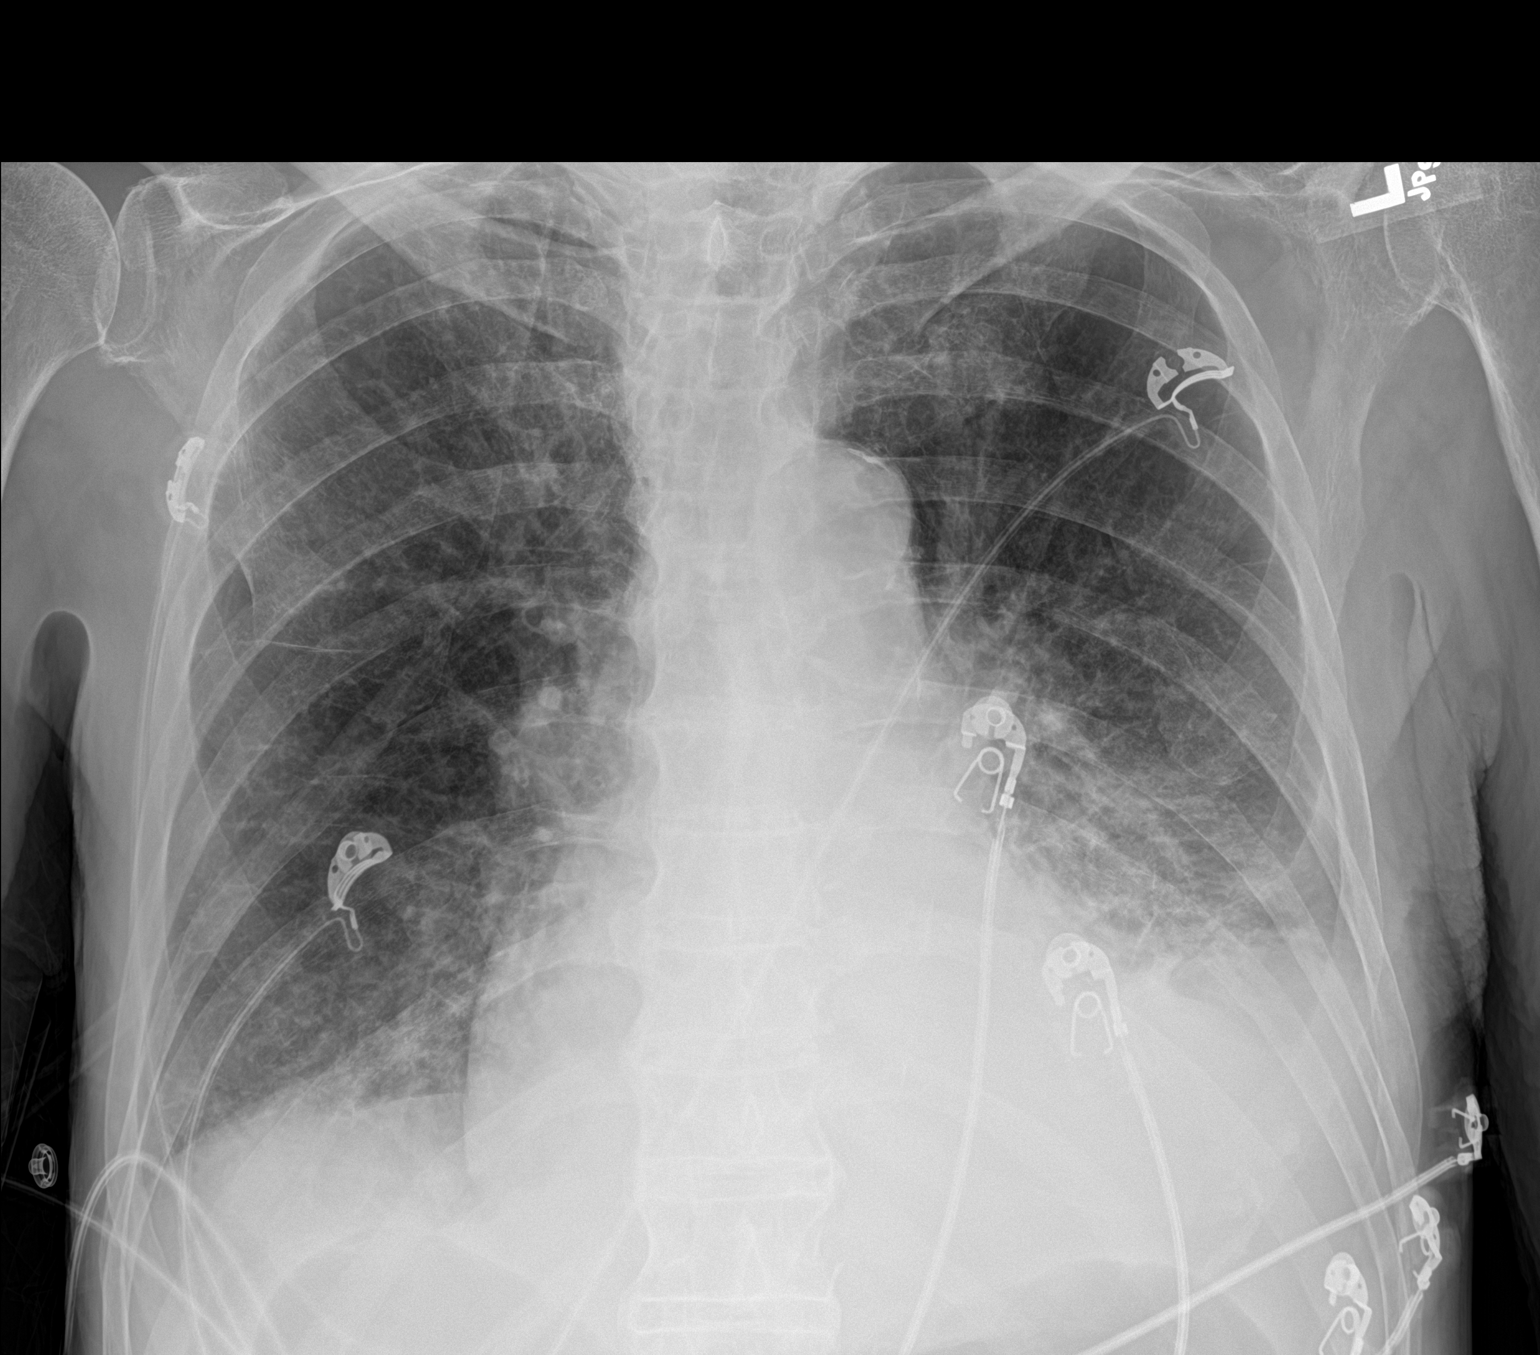

[1 of 1 positions shown; findings below may reference images not displayed]

FINDINGS: New cardiomegaly with pulmonary vascular congestion and mild
interstitial edema. Atherosclerotic calcification of the aortic
arch. Small left greater than right pleural effusions with left
lower lobe consolidation/atelectasis. Mild right basilar
subsegmental atelectasis. No pneumothorax. No acute osseous
abnormality.
IMPRESSION: 1. New cardiomegaly and mild interstitial edema with small bilateral
pleural effusions.
2. Left lower lobe consolidation versus atelectasis.
